# Patient Record
Sex: Male | Born: 2000 | Race: Black or African American | Hispanic: No | Smoking: Never smoker
Health system: Southern US, Community
[De-identification: ages and names within clinical notes are randomized; demographics above are authoritative.]

## PROBLEM LIST (undated history)

## (undated) DIAGNOSIS — D849 Immunodeficiency, unspecified: Secondary | ICD-10-CM

## (undated) DIAGNOSIS — F909 Attention-deficit hyperactivity disorder, unspecified type: Secondary | ICD-10-CM

## (undated) DIAGNOSIS — Z21 Asymptomatic human immunodeficiency virus [HIV] infection status: Secondary | ICD-10-CM

## (undated) DIAGNOSIS — B20 Human immunodeficiency virus [HIV] disease: Secondary | ICD-10-CM

## (undated) HISTORY — DX: Attention-deficit hyperactivity disorder, unspecified type: F90.9

## (undated) HISTORY — PX: ORIF TIBIA FRACTURE: SHX5416

---

## 2019-08-03 ENCOUNTER — Ambulatory Visit (INDEPENDENT_AMBULATORY_CARE_PROVIDER_SITE_OTHER): Payer: Medicaid Other | Admitting: Pharmacist

## 2019-08-03 ENCOUNTER — Telehealth: Payer: Self-pay | Admitting: Pharmacy Technician

## 2019-08-03 ENCOUNTER — Other Ambulatory Visit: Payer: Self-pay

## 2019-08-03 ENCOUNTER — Ambulatory Visit (INDEPENDENT_AMBULATORY_CARE_PROVIDER_SITE_OTHER): Payer: Medicaid Other | Admitting: Family

## 2019-08-03 ENCOUNTER — Encounter: Payer: Self-pay | Admitting: Family

## 2019-08-03 VITALS — BP 153/54 | HR 110 | Temp 98.6°F | Wt 173.0 lb

## 2019-08-03 DIAGNOSIS — B20 Human immunodeficiency virus [HIV] disease: Secondary | ICD-10-CM

## 2019-08-03 MED ORDER — BICTEGRAVIR-EMTRICITAB-TENOFOV 50-200-25 MG PO TABS: 1.0000 | ORAL_TABLET | Freq: Every day | ORAL | 2 refills | Status: DC

## 2019-08-03 NOTE — Telephone Encounter (Signed)
[facility] Patient Advocate Encounter  Insurance verification completed.    The patient is insured through [place] [employer]ICA[employer] and has a $0 copay.    Alyza Artiaga E. Ravi Tuccillo, CPhT [facility]ty [facility] Phone: [phone] Fax:  [phone]   

## 2019-08-03 NOTE — Progress Notes (Signed)
 HPI: Gavin Spencer is a 18 y.o. male who presents to the [facility] clinic today to initiate care with Greg for his newly diagnosed HIV infection.  There are no active problems to display for this patient.   Patient's Medications  New Prescriptions   No medications on file  Previous Medications   BICTEGRAVIR-EMTRICITABINE-TENOFOVIR AF (BIKTARVY) 50-200-25 MG TABS TABLET    Take 1 tablet by mouth daily.  Modified Medications   No medications on file  Discontinued Medications   No medications on file    Allergies: No Known Allergies  Past Medical History: Past Medical History:  Diagnosis Date  . ADHD     Social History: Social History   Socioeconomic History  . Marital status: Single    Spouse name: Not on file  . Number of children: 0  . Years of education: 14  . Highest education level: Not on file  Occupational History  . Not on file  Social Needs  . Financial resource strain: Not on file  . Food insecurity    Worry: Not on file    Inability: Not on file  . Transportation needs    Medical: Not on file    Non-medical: Not on file  Tobacco Use  . Smoking status: Never Smoker  . Smokeless tobacco: Never Used  Substance and Sexual Activity  . Alcohol use: Yes    Comment: Occasional  . Drug use: Never  . Sexual activity: Not on file  Lifestyle  . Physical activity    Days per week: Not on file    Minutes per session: Not on file  . Stress: Not on file  Relationships  . Social connections    Talks on phone: Not on file    Gets together: Not on file    Attends religious service: Not on file    Active member of club or organization: Not on file    Attends meetings of clubs or organizations: Not on file    Relationship status: Not on file  Other Topics Concern  . Not on file  Social History Narrative  . Not on file    Labs: No results found for: HIV1RNAQUANT, HIV1RNAVL, CD4TABS  RPR and STI No results found for: LABRPR, RPRTITER  No flowsheet data  found.  Hepatitis B No results found for: HEPBSAB, HEPBSAG, HEPBCAB Hepatitis C No results found for: HEPCAB, HCVRNAPCRQN Hepatitis A No results found for: HAV Lipids: No results found for: CHOL, TRIG, HDL, CHOLHDL, VLDL, LDLCALC  Current HIV Regimen: Treatment naive - will start Biktarvy  Assessment: Gavin Spencer is here today to initiate care with Greg for his newly diagnosed HIV infection.  He is treatment naive with a new HIV infection, we will obtain a baseline HIV viral load and CD4 count today. We will also do an HIV genotype to assess for any resistance mechanisms. Will start patient on Biktarvy.  Gavin Spencer was counseled he will need to take his Biktarvy at the same time on a daily basis, with or without food. Some of the most common side effects include headache and nausea/vomiting. He may take over the counter pain relievers to treat any headaches he initially experiences and he should try taking the Biktarvy with food if it upsets his stomach. He expressed understanding of everything we talked about and will call to let us know if he starts any new medication, experiences any unusual side effects or has any questions. The patient has Medicaid and will be able to pick his medication up   this afternoon to start therapy.  Plan: -Start Biktarvy -Check CMET, HIV viral load, CD4, CBC, lipid panel, RPR, HLA B*5701, G6PD and quantiferon-TB Gold -Follow up with Marya Amsler in a month   Nicoletta Dress, PharmD PGY2 Infectious Disease Pharmacy Resident  [facility] 08/03/2019, 2:24 PM

## 2019-08-03 NOTE — Progress Notes (Signed)
Subjective:    Patient ID: Gavin Spencer, male    DOB: 2001-09-13, 18 y.o.   MRN: [id number]  Chief Complaint  Patient presents with  . HIV Positive/AIDS    HPI:  Gavin Spencer is a 18 y.o. male with previous medical history of ADD who presents today for a positive HIV test.   [facility] for concern of sexually transmitted infection due to his partner testing positive for Gonorrhea. He also was positive for gonorrhea and was treated with ceftriaxone and azithromycin. At this visit he completed HIV screening as well which returned positive. HIV antibody testing was negative but HIV RNA levels were present which was consistent with acute HIV infection. Currently has no symptoms and is feeling well. Denies fevers, chills, night sweats, headaches, changes in vision, neck pain/stiffness, nausea, diarrhea, vomiting, lesions or rashes.   Gavin Spencer is in his sophomore year of school at [employer]. He just started working at [employer]. Risk factor for acquiring HIV is MSM and believes he knows when he may have been infected. Denies any recreational or illicit drug use or tobacco use. Alcohol use is on occasion. He does have a psychiatry history of ADD. He was raised through the foster care system and has no current close family contacts. He has not shared his diagnosis, but has researched a little on the Internet.    No Known Allergies    No outpatient medications prior to visit.   No facility-administered medications prior to visit.      Past Medical History:  Diagnosis Date  . ADHD       History reviewed. No pertinent surgical history.    Family History  Adopted: Yes      Social History   Socioeconomic History  . Marital status: Single    Spouse name: Not on file  . Number of children: 0  . Years of education: 70  . Highest education level: Not on file  Occupational History  .  Not on file  Social Needs  . Financial resource strain: Not on file  . Food insecurity    Worry: Not on file    Inability: Not on file  . Transportation needs    Medical: Not on file    Non-medical: Not on file  Tobacco Use  . Smoking status: Never Smoker  . Smokeless tobacco: Never Used  Substance and Sexual Activity  . Alcohol use: Yes    Comment: Occasional  . Drug use: Never  . Sexual activity: Not on file  Lifestyle  . Physical activity    Days per week: Not on file    Minutes per session: Not on file  . Stress: Not on file  Relationships  . Social Herbalist on phone: Not on file    Gets together: Not on file    Attends religious service: Not on file    Active member of club or organization: Not on file    Attends meetings of clubs or organizations: Not on file    Relationship status: Not on file  . Intimate partner violence    Fear of current or ex partner: Not on file    Emotionally abused: Not on file    Physically abused: Not on file    Forced sexual activity: Not on file  Other Topics Concern  . Not on file  Social History Narrative  . Not on file  Review of Systems  Constitutional: Negative for appetite change, chills, fatigue, fever and unexpected weight change.  Eyes: Negative for visual disturbance.  Respiratory: Negative for cough, chest tightness, shortness of breath and wheezing.   Cardiovascular: Negative for chest pain and leg swelling.  Gastrointestinal: Negative for abdominal pain, constipation, diarrhea, nausea and vomiting.  Genitourinary: Negative for dysuria, flank pain, frequency, genital sores, hematuria and urgency.  Skin: Negative for rash.  Allergic/Immunologic: Negative for immunocompromised state.  Neurological: Negative for dizziness and headaches.       Objective:    BP (!) 153/54   Pulse (!) 110   Temp 98.6 F (37 C)   Wt 173 lb (78.5 kg)  Nursing note and vital signs reviewed.  Physical Exam  Constitutional:      General: He is not in acute distress.    Appearance: He is well-developed.  Eyes:     Conjunctiva/sclera: Conjunctivae normal.  Neck:     Musculoskeletal: Neck supple.  Cardiovascular:     Rate and Rhythm: Regular rhythm. Tachycardia present.     Heart sounds: Normal heart sounds. No murmur. No friction rub. No gallop.   Pulmonary:     Effort: Pulmonary effort is normal. No respiratory distress.     Breath sounds: Normal breath sounds. No wheezing or rales.  Chest:     Chest wall: No tenderness.  Abdominal:     General: Bowel sounds are normal.     Palpations: Abdomen is soft.     Tenderness: There is no abdominal tenderness.  Lymphadenopathy:     Cervical: No cervical adenopathy.  Skin:    General: Skin is warm and dry.     Findings: No rash.  Neurological:     Mental Status: He is alert and oriented to person, place, and time.  Psychiatric:        Mood and Affect: Mood is anxious.        Behavior: Behavior normal.        Thought Content: Thought content normal.        Judgment: Judgment normal.        Assessment & Plan:   Patient Active Problem List   Diagnosis Date Noted  . Acute HIV infection ([place]) 08/03/2019     Problem List Items Addressed This Visit      Other   Acute HIV infection ([place]) - Primary    Gavin Spencer is a 18 y/o male with apparent acute HIV infection with HIV RNA present and antibody negative likely acquired through MSM. He is asymptomatic and has no signs/symptoms of opportunistic infection. We discussed the pathophysiology, transmission, prevention, risks if left untreated/progression, and treatment of HIV with time for questions. Clinic orientation and introduction to staff including pharmacy and financial counseling was also completed. He declines counseling at the present time and is mentally doing okay. Obtain baseline blood work today.Covered through Medicaid and will plan to start [place]. Follow up in 1 month or sooner  if needed.       Relevant Medications   bictegravir-emtricitabine-tenofovir AF (BIKTARVY) 50-200-25 MG TABS tablet   Other Relevant Orders   COMPLETE METABOLIC PANEL WITH GFR   Glucose 6 phosphate dehydrogenase   HIV-1 RNA ultraquant reflex to gentyp+   HLA B*5701   Lipid panel   RPR   T-helper cell (CD4)- ([facility] clinic only)   QuantiFERON-TB Gold Plus   CBC w/Diff       I am having [name] start on bictegravir-emtricitabine-tenofovir AF.   Meds  ordered this encounter  Medications  . bictegravir-emtricitabine-tenofovir AF (BIKTARVY) 50-200-25 MG TABS tablet    Sig: Take 1 tablet by mouth daily.    Dispense:  30 tablet    Refill:  2    Order Specific Question:   Supervising Provider    Answer:   Carlyle Basques [[id number]]     Follow-up: Return in about 1 month (around 09/02/2019), or if symptoms worsen or fail to improve.    Terri Piedra, MSN, FNP-C Nurse Practitioner [facility] Office phone: [phone] Pager: [place] number: [phone]

## 2019-08-03 NOTE — Assessment & Plan Note (Signed)
Gavin Spencer is a 18 y/o male with apparent acute HIV infection with HIV RNA present and antibody negative likely acquired through MSM. He is asymptomatic and has no signs/symptoms of opportunistic infection. We discussed the pathophysiology, transmission, prevention, risks if left untreated/progression, and treatment of HIV with time for questions. Clinic orientation and introduction to staff including pharmacy and financial counseling was also completed. He declines counseling at the present time and is mentally doing okay. Obtain baseline blood work today.Covered through Medicaid and will plan to start [place]. Follow up in 1 month or sooner if needed.

## 2019-08-03 NOTE — Patient Instructions (Signed)
Nice to meet you.  We will check your blood work today and inform you via MyChart of the results.  Your medication is called Biktarvy and will be available at [facility] at [place].  Please let us know if you have any questions.  Plan for follow up in 1 month or sooner if needed - we will do lab work at that appointment.   Have a great day and stay safe!

## 2019-08-04 LAB — T-HELPER CELL (CD4) - (RCID CLINIC ONLY)
CD4 % Helper T Cell: 20 % — ABNORMAL LOW (ref 33–65)
CD4 T Cell Abs: 394 /uL — ABNORMAL LOW (ref 400–1790)

## 2019-08-17 LAB — QUANTIFERON-TB GOLD PLUS
Mitogen-NIL: >10 IU/mL
NIL: 0.05 IU/mL
QuantiFERON-TB Gold Plus: NEGATIVE
TB1-NIL: 0 IU/mL
TB2-NIL: 0 IU/mL

## 2019-08-17 LAB — COMPLETE METABOLIC PANEL WITH GFR
AG Ratio: 1.5 (calc) (ref 1.0–2.5)
ALT: 145 U/L — ABNORMAL HIGH (ref 8–46)
AST: 64 U/L — ABNORMAL HIGH (ref 12–32)
Albumin: 4.9 g/dL (ref 3.6–5.1)
Alkaline phosphatase (APISO): 92 U/L (ref 46–169)
BUN: 13 mg/dL (ref 7–20)
CO2: 26 mmol/L (ref 20–32)
Calcium: 10.6 mg/dL — ABNORMAL HIGH (ref 8.9–10.4)
Chloride: 102 mmol/L (ref 98–110)
Creat: 1.06 mg/dL (ref 0.60–1.26)
GFR, Est African American: 117 mL/min/{1.73_m2} (ref >=60)
GFR, Est Non African American: 101 mL/min/{1.73_m2} (ref >=60)
Globulin: 3.2 g/dL (calc) (ref 2.1–3.5)
Glucose, Bld: 90 mg/dL (ref 65–99)
Potassium: 4.6 mmol/L (ref 3.8–5.1)
Sodium: 138 mmol/L (ref 135–146)
Total Bilirubin: 0.7 mg/dL (ref 0.2–1.1)
Total Protein: 8.1 g/dL (ref 6.3–8.2)

## 2019-08-17 LAB — CBC WITH DIFFERENTIAL/PLATELET
Absolute Monocytes: 318 cells/uL (ref 200–950)
Basophils Absolute: 22 cells/uL (ref 0–200)
Basophils Relative: 0.5 %
Eosinophils Absolute: 9 cells/uL — ABNORMAL LOW (ref 15–500)
Eosinophils Relative: 0.2 %
HCT: 43.6 % (ref 38.5–50.0)
Hemoglobin: 15.2 g/dL (ref 13.2–17.1)
Lymphs Abs: 2150 cells/uL (ref 850–3900)
MCH: 27.2 pg (ref 27.0–33.0)
MCHC: 34.9 g/dL (ref 32.0–36.0)
MCV: 78 fL — ABNORMAL LOW (ref 80.0–100.0)
MPV: 9.8 fL (ref 7.5–12.5)
Monocytes Relative: 7.4 %
Neutro Abs: 1802 cells/uL (ref 1500–7800)
Neutrophils Relative %: 41.9 %
Platelets: 353 10*3/uL (ref 140–400)
RBC: 5.59 10*6/uL (ref 4.20–5.80)
RDW: 12.7 % (ref 11.0–15.0)
Total Lymphocyte: 50 %
WBC: 4.3 10*3/uL (ref 3.8–10.8)

## 2019-08-17 LAB — LIPID PANEL
Cholesterol: 148 mg/dL (ref <170)
HDL: 41 mg/dL — ABNORMAL LOW (ref >45)
LDL Cholesterol (Calc): 96 mg/dL (calc) (ref <110)
Non-HDL Cholesterol (Calc): 107 mg/dL (calc) (ref <120)
Total CHOL/HDL Ratio: 3.6 (calc) (ref <5.0)
Triglycerides: 37 mg/dL (ref <90)

## 2019-08-17 LAB — HLA B*5701: HLA-B*5701 w/rflx HLA-B High: NEGATIVE

## 2019-08-17 LAB — RPR: RPR Ser Ql: NONREACTIVE

## 2019-08-17 LAB — HIV-1 GENOTYPE: HIV-1 Genotype: DETECTED — AB

## 2019-08-17 LAB — GLUCOSE 6 PHOSPHATE DEHYDROGENASE: G-6PDH: 10.2 U/g Hgb (ref 7.0–20.5)

## 2019-08-17 LAB — HIV-1 RNA ULTRAQUANT REFLEX TO GENTYP+
HIV 1 RNA Quant: 224000 copies/mL — ABNORMAL HIGH
HIV-1 RNA Quant, Log: 5.35 Log copies/mL — ABNORMAL HIGH

## 2019-08-31 ENCOUNTER — Encounter: Payer: Self-pay | Admitting: Family

## 2019-09-02 ENCOUNTER — Ambulatory Visit (INDEPENDENT_AMBULATORY_CARE_PROVIDER_SITE_OTHER): Payer: Medicaid Other | Admitting: Family

## 2019-09-02 ENCOUNTER — Encounter: Payer: Self-pay | Admitting: Family

## 2019-09-02 ENCOUNTER — Other Ambulatory Visit: Payer: Self-pay

## 2019-09-02 VITALS — BP 144/89 | HR 80 | Temp 98.0°F | Wt 184.0 lb

## 2019-09-02 DIAGNOSIS — Z0000 Encounter for general adult medical examination without abnormal findings: Secondary | ICD-10-CM | POA: Diagnosis not present

## 2019-09-02 DIAGNOSIS — Z113 Encounter for screening for infections with a predominantly sexual mode of transmission: Secondary | ICD-10-CM

## 2019-09-02 DIAGNOSIS — B20 Human immunodeficiency virus [HIV] disease: Secondary | ICD-10-CM | POA: Diagnosis present

## 2019-09-02 LAB — T-HELPER CELL (CD4) - (RCID CLINIC ONLY)
CD4 % Helper T Cell: 29 % — ABNORMAL LOW (ref 33–65)
CD4 T Cell Abs: 640 /uL (ref 400–1790)

## 2019-09-02 NOTE — Patient Instructions (Signed)
Good to see you.  We will check your blood work today.  Continue to take your Mucarabones as prescribed daily.  Plan for follow up in 6 weeks or sooner if needed with lab work 1-2 weeks prior to your appointment.   Have a great day and stay safe!  Happy Holidays!

## 2019-09-02 NOTE — Assessment & Plan Note (Signed)
Gavin Spencer appears to be doing well with good adherence and tolerance to his ART regimen of Biktarvy.  No signs/symptoms of opportunistic infection or progressive HIV disease.  Discussed U equals U.  Reviewed previous lab work and discussed plan of care.  We will check blood work today.  Continue current dose of Biktarvy.  Plan for follow-up in 6 weeks or sooner if needed with lab work 1 to 2 weeks prior to appointment.

## 2019-09-02 NOTE — Progress Notes (Signed)
Subjective:    Patient ID: Gavin Spencer, male    DOB: 13-Jun-2001, 18 y.o.   MRN: [id number]  Chief Complaint  Patient presents with  . Follow-up    offered condoms; offered flu shot (considering); asked about food resources; will provide; no other complaints     HPI:  Gavin Spencer is a 18 y.o. male with newly diagnosed HIV who was last seen in the office on 08/03/2019 to establish care with a viral load of 224,000 and CD4 nadir of 394.  Genotype was without significant resistance showing wild-type virus.  He was started on Biktarvy.  Gavin Spencer returns today having taking his Biktarvy as prescribed with no adverse side effects or missed doses since his last office visit.  Overall feeling well with no new concerns/complaints. Denies fevers, chills, night sweats, headaches, changes in vision, neck pain/stiffness, nausea, diarrhea, vomiting, lesions or rashes.  Mr. Matheny has no problems obtaining his medications from the pharmacy and remains covered through [facility].  Denies feelings of being down, depressed, or hopeless recently.  No recreational or illicit drug use, tobacco use, or alcohol consumption.  Not currently sexually active.  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   No Known Allergies    Outpatient Medications Prior to Visit  Medication Sig Dispense Refill  . bictegravir-emtricitabine-tenofovir AF (BIKTARVY) 50-200-25 MG TABS tablet Take 1 tablet by mouth daily. 30 tablet 2   No facility-administered medications prior to visit.      Past Medical History:  Diagnosis Date  . ADHD     History reviewed. No pertinent surgical history.   Review of Systems  Constitutional: Negative for appetite change, chills, fatigue, fever and unexpected weight change.  Eyes: Negative for visual disturbance.  Respiratory: Negative for cough, chest tightness, shortness of breath and wheezing.   Cardiovascular:  Negative for chest pain and leg swelling.  Gastrointestinal: Negative for abdominal pain, constipation, diarrhea, nausea and vomiting.  Genitourinary: Negative for dysuria, flank pain, frequency, genital sores, hematuria and urgency.  Skin: Negative for rash.  Allergic/Immunologic: Negative for immunocompromised state.  Neurological: Negative for dizziness and headaches.      Objective:    BP (!) 144/89   Pulse 80   Temp 98 F (36.7 C) (Oral)   Wt 184 lb (83.5 kg)  Nursing note and vital signs reviewed.  Physical Exam Constitutional:      General: He is not in acute distress.    Appearance: He is well-developed.  Eyes:     Conjunctiva/sclera: Conjunctivae normal.  Neck:     Musculoskeletal: Neck supple.  Cardiovascular:     Rate and Rhythm: Normal rate and regular rhythm.     Heart sounds: Normal heart sounds. No murmur. No friction rub. No gallop.   Pulmonary:     Effort: Pulmonary effort is normal. No respiratory distress.     Breath sounds: Normal breath sounds. No wheezing or rales.  Chest:     Chest wall: No tenderness.  Abdominal:     General: Bowel sounds are normal.     Palpations: Abdomen is soft.     Tenderness: There is no abdominal tenderness.  Lymphadenopathy:     Cervical: No cervical adenopathy.  Skin:    General: Skin is warm and dry.     Findings: No rash.  Neurological:     Mental Status: He is alert and oriented to person, place, and time.  Psychiatric:  Behavior: Behavior normal.        Thought Content: Thought content normal.        Judgment: Judgment normal.      Depression screen [facility] 2/9 09/02/2019 08/03/2019  Decreased Interest 0 0  Down, Depressed, Hopeless 0 0  PHQ - 2 Score 0 0       Assessment & Plan:    Patient Active Problem List   Diagnosis Date Noted  . Healthcare maintenance 09/02/2019  . Acute HIV infection ([place]) 08/03/2019     Problem List Items Addressed This Visit      Other   Acute HIV infection ([place]) -  Primary    Gavin Spencer appears to be doing well with good adherence and tolerance to his ART regimen of Biktarvy.  No signs/symptoms of opportunistic infection or progressive HIV disease.  Discussed U equals U.  Reviewed previous lab work and discussed plan of care.  We will check blood work today.  Continue current dose of Biktarvy.  Plan for follow-up in 6 weeks or sooner if needed with lab work 1 to 2 weeks prior to appointment.      Relevant Orders   COMPLETE METABOLIC PANEL WITH GFR   T-helper cell (CD4)- ([facility] clinic only)   HIV-1 RNA quant-no reflex-bld   6 month CD4   6 month VL   6 month Apison maintenance     Declines vaccinations today.  Discussed importance of safe sexual practice to reduce risk of STI.  Condoms provided.       Other Visit Diagnoses    Screening for STDs (sexually transmitted diseases)       Relevant Orders   6 month RPR       I am having [name] maintain his bictegravir-emtricitabine-tenofovir AF.   Follow-up: Return in about 6 weeks (around 10/14/2019), or if symptoms worsen or fail to improve.   Terri Piedra, MSN, FNP-C Nurse Practitioner [facility] [place] number: [phone]

## 2019-09-02 NOTE — Assessment & Plan Note (Signed)
   Declines vaccinations today.  Discussed importance of safe sexual practice to reduce risk of STI.  Condoms provided. 

## 2019-09-12 LAB — COMPLETE METABOLIC PANEL WITH GFR
AG Ratio: 2 (calc) (ref 1.0–2.5)
ALT: 30 U/L (ref 8–46)
AST: 26 U/L (ref 12–32)
Albumin: 4.6 g/dL (ref 3.6–5.1)
Alkaline phosphatase (APISO): 79 U/L (ref 46–169)
BUN: 12 mg/dL (ref 7–20)
CO2: 25 mmol/L (ref 20–32)
Calcium: 9.9 mg/dL (ref 8.9–10.4)
Chloride: 106 mmol/L (ref 98–110)
Creat: 1.17 mg/dL (ref 0.60–1.26)
GFR, Est African American: 104 mL/min/{1.73_m2} (ref >=60)
GFR, Est Non African American: 90 mL/min/{1.73_m2} (ref >=60)
Globulin: 2.3 g/dL (calc) (ref 2.1–3.5)
Glucose, Bld: 89 mg/dL (ref 65–99)
Potassium: 4.1 mmol/L (ref 3.8–5.1)
Sodium: 141 mmol/L (ref 135–146)
Total Bilirubin: 0.4 mg/dL (ref 0.2–1.1)
Total Protein: 6.9 g/dL (ref 6.3–8.2)

## 2019-09-12 LAB — HIV-1 RNA QUANT-NO REFLEX-BLD
HIV 1 RNA Quant: <20 copies/mL — AB
HIV-1 RNA Quant, Log: <1.3 Log copies/mL — AB

## 2019-10-03 ENCOUNTER — Other Ambulatory Visit: Payer: Self-pay

## 2019-10-03 ENCOUNTER — Other Ambulatory Visit: Payer: Medicaid Other

## 2019-10-03 DIAGNOSIS — B20 Human immunodeficiency virus [HIV] disease: Secondary | ICD-10-CM

## 2019-10-03 DIAGNOSIS — Z113 Encounter for screening for infections with a predominantly sexual mode of transmission: Secondary | ICD-10-CM

## 2019-10-04 LAB — T-HELPER CELL (CD4) - (RCID CLINIC ONLY)
CD4 % Helper T Cell: 28 % — ABNORMAL LOW (ref 33–65)
CD4 T Cell Abs: 500 /uL (ref 400–1790)

## 2019-10-07 LAB — COMPREHENSIVE METABOLIC PANEL
AG Ratio: 2 (calc) (ref 1.0–2.5)
ALT: 18 U/L (ref 8–46)
AST: 13 U/L (ref 12–32)
Albumin: 4.7 g/dL (ref 3.6–5.1)
Alkaline phosphatase (APISO): 76 U/L (ref 46–169)
BUN: 8 mg/dL (ref 7–20)
CO2: 31 mmol/L (ref 20–32)
Calcium: 10.4 mg/dL (ref 8.9–10.4)
Chloride: 105 mmol/L (ref 98–110)
Creat: 1.07 mg/dL (ref 0.60–1.26)
Globulin: 2.3 g/dL (calc) (ref 2.1–3.5)
Glucose, Bld: 65 mg/dL (ref 65–99)
Potassium: 4.2 mmol/L (ref 3.8–5.1)
Sodium: 142 mmol/L (ref 135–146)
Total Bilirubin: 0.5 mg/dL (ref 0.2–1.1)
Total Protein: 7 g/dL (ref 6.3–8.2)

## 2019-10-07 LAB — RPR: RPR Ser Ql: NONREACTIVE

## 2019-10-07 LAB — HIV-1 RNA QUANT-NO REFLEX-BLD
HIV 1 RNA Quant: <20 copies/mL
HIV-1 RNA Quant, Log: <1.3 Log copies/mL

## 2019-10-21 ENCOUNTER — Telehealth: Payer: Self-pay

## 2019-10-21 NOTE — Telephone Encounter (Signed)
COVID-19 Pre-Screening Questions:10/21/19  Do you currently have a fever (>100 F), chills or unexplained body aches?NO   Are you currently experiencing new cough, shortness of breath, sore throat, runny nose? NO .  Have you recently travelled outside the state of [place] in the last 14 days?NO  .  Have you been in contact with someone that is currently pending confirmation of Covid19 testing or has been confirmed to have the Covid19 virus?  NO  **If the patient answers NO to ALL questions -  advise the patient to please call the clinic before coming to the office should any symptoms develop.     

## 2019-10-24 ENCOUNTER — Ambulatory Visit (INDEPENDENT_AMBULATORY_CARE_PROVIDER_SITE_OTHER): Payer: Medicaid Other | Admitting: Family

## 2019-10-24 ENCOUNTER — Other Ambulatory Visit: Payer: Self-pay

## 2019-10-24 ENCOUNTER — Encounter: Payer: Self-pay | Admitting: Family

## 2019-10-24 VITALS — BP 141/80 | HR 83 | Temp 98.0°F | Ht 67.0 in | Wt 176.0 lb

## 2019-10-24 DIAGNOSIS — Z113 Encounter for screening for infections with a predominantly sexual mode of transmission: Secondary | ICD-10-CM | POA: Diagnosis not present

## 2019-10-24 DIAGNOSIS — B20 Human immunodeficiency virus [HIV] disease: Secondary | ICD-10-CM | POA: Diagnosis present

## 2019-10-24 DIAGNOSIS — Z0000 Encounter for general adult medical examination without abnormal findings: Secondary | ICD-10-CM | POA: Diagnosis not present

## 2019-10-24 MED ORDER — BICTEGRAVIR-EMTRICITAB-TENOFOV 50-200-25 MG PO TABS: 1.0000 | ORAL_TABLET | Freq: Every day | ORAL | 2 refills | Status: DC

## 2019-10-24 NOTE — Patient Instructions (Signed)
Nice to see you.  We will check your HSV titer today.  Continue to take your [place] as prescribed daily.   Refills will be sent to the pharmacy.  Plan for follow up in 3 months or sooner if needed with lab work 1-2 weeks prior to appointment.  Have a great day and stay safe!

## 2019-10-24 NOTE — Assessment & Plan Note (Signed)
Gavin Spencer continues to do well with good adherence and tolerance to his ART regimen of Biktarvy.  No signs/symptoms of opportunistic infection or progressive HIV disease.  Reviewed lab work and discussed plan of care.  He would like to be checked for herpes simplex virus.  Continue current dose of Biktarvy.  Plan for follow-up in 3 months or sooner if needed with lab work 1 to 2 weeks prior to appointment on same day.

## 2019-10-24 NOTE — Assessment & Plan Note (Signed)
   Discussed importance of safe sexual practice to reduce risk of STI.  Check for herpes simplex virus today per Mr. Reicher request.

## 2019-10-24 NOTE — Progress Notes (Signed)
Subjective:    Patient ID: Gavin Spencer, male    DOB: 2001/02/25, 19 y.o.   MRN: [id number]  Chief Complaint  Patient presents with  . Follow-up     HPI:  Gavin Spencer is a 19 y.o. male with HIV disease who was last seen on 09/02/19 with good adherence and tolerance to his ART regimen of Biktarvy. Viral load at that time was undetectable with CD4 count of 640. Most recent blood work completed on 10/03/19 with viral load that remains undetectable and CD4 count of 500.   Gavin Spencer continues to take his Biktarvy as prescribed with no adverse side effects or missed doses.  Overall feeling well today with no new concerns/complaints. Denies fevers, chills, night sweats, headaches, changes in vision, neck pain/stiffness, nausea, diarrhea, vomiting, lesions or rashes.  Gavin Spencer has no problems obtaining his medication from the pharmacy and remains covered through [facility].  Denies any feelings of being down, depressed, or hopeless recently.  He does smoke marijuana every other day and drinks alcohol socially.  Denies tobacco use.  Uses condoms when sexually active.   No Known Allergies    Outpatient Medications Prior to Visit  Medication Sig Dispense Refill  . bictegravir-emtricitabine-tenofovir AF (BIKTARVY) 50-200-25 MG TABS tablet Take 1 tablet by mouth daily. 30 tablet 2   No facility-administered medications prior to visit.     Past Medical History:  Diagnosis Date  . ADHD     History reviewed. No pertinent surgical history.   Review of Systems  Constitutional: Negative for appetite change, chills, fatigue, fever and unexpected weight change.  Eyes: Negative for visual disturbance.  Respiratory: Negative for cough, chest tightness, shortness of breath and wheezing.   Cardiovascular: Negative for chest pain and leg swelling.  Gastrointestinal: Negative for abdominal pain, constipation, diarrhea, nausea and vomiting.  Genitourinary: Negative for dysuria, flank  pain, frequency, genital sores, hematuria and urgency.  Skin: Negative for rash.  Allergic/Immunologic: Negative for immunocompromised state.  Neurological: Negative for dizziness and headaches.      Objective:    BP (!) 141/80   Pulse 83   Temp 98 F (36.7 C)   Ht 5\' 7"  (1.702 m)   Wt 176 lb (79.8 kg)   SpO2 98%   BMI 27.57 kg/m  Nursing note and vital signs reviewed.  Physical Exam Constitutional:      General: He is not in acute distress.    Appearance: He is well-developed.  Eyes:     Conjunctiva/sclera: Conjunctivae normal.  Cardiovascular:     Rate and Rhythm: Normal rate and regular rhythm.     Heart sounds: Normal heart sounds. No murmur. No friction rub. No gallop.   Pulmonary:     Effort: Pulmonary effort is normal. No respiratory distress.     Breath sounds: Normal breath sounds. No wheezing or rales.  Chest:     Chest wall: No tenderness.  Abdominal:     General: Bowel sounds are normal.     Palpations: Abdomen is soft.     Tenderness: There is no abdominal tenderness.  Musculoskeletal:     Cervical back: Neck supple.  Lymphadenopathy:     Cervical: No cervical adenopathy.  Skin:    General: Skin is warm and dry.     Findings: No rash.  Neurological:     Mental Status: He is alert and oriented to person, place, and time.  Psychiatric:        Behavior: Behavior normal.  Thought Content: Thought content normal.        Judgment: Judgment normal.      Depression screen [facility] 2/9 09/02/2019 08/03/2019  Decreased Interest 0 0  Down, Depressed, Hopeless 0 0  PHQ - 2 Score 0 0       Assessment & Plan:    Patient Active Problem List   Diagnosis Date Noted  . Healthcare maintenance 09/02/2019  . HIV disease ([place]) 08/03/2019     Problem List Items Addressed This Visit      Other   HIV disease ([place]) - Primary    Gavin Spencer continues to do well with good adherence and tolerance to his ART regimen of Biktarvy.  No signs/symptoms of  opportunistic infection or progressive HIV disease.  Reviewed lab work and discussed plan of care.  He would like to be checked for herpes simplex virus.  Continue current dose of Biktarvy.  Plan for follow-up in 3 months or sooner if needed with lab work 1 to 2 weeks prior to appointment on same day.      Relevant Medications   bictegravir-emtricitabine-tenofovir AF (BIKTARVY) 50-200-25 MG TABS tablet   Other Relevant Orders   T-helper cell (CD4)- ([facility] clinic only)   HIV-1 RNA quant-no reflex-bld   Comprehensive metabolic panel   Healthcare maintenance     Discussed importance of safe sexual practice to reduce risk of STI.  Check for herpes simplex virus today per Gavin Spencer request.       Other Visit Diagnoses    Screening for STDs (sexually transmitted diseases)       Relevant Orders   HSV(herpes simplex vrs) 1+2 ab-IgG   RPR   Urine cytology ancillary only([place])       I am having Gavin Spencer maintain his bictegravir-emtricitabine-tenofovir AF.   Meds ordered this encounter  Medications  . bictegravir-emtricitabine-tenofovir AF (BIKTARVY) 50-200-25 MG TABS tablet    Sig: Take 1 tablet by mouth daily.    Dispense:  30 tablet    Refill:  2    Order Specific Question:   Supervising Provider    Answer:   Carlyle Basques [[id number]]     Follow-up: Return in about 3 months (around 01/22/2020), or if symptoms worsen or fail to improve.   Terri Piedra, MSN, FNP-C Nurse Practitioner [facility] for Infectious Disease [place] number: [phone]

## 2019-10-25 LAB — HSV(HERPES SIMPLEX VRS) I + II AB-IGG
HAV 1 IGG,TYPE SPECIFIC AB: 1.63 index — ABNORMAL HIGH
HSV 2 IGG,TYPE SPECIFIC AB: <0.9 index

## 2020-01-09 ENCOUNTER — Other Ambulatory Visit: Payer: Medicaid Other

## 2020-01-09 ENCOUNTER — Other Ambulatory Visit: Payer: Self-pay

## 2020-01-09 DIAGNOSIS — B20 Human immunodeficiency virus [HIV] disease: Secondary | ICD-10-CM

## 2020-01-09 DIAGNOSIS — Z113 Encounter for screening for infections with a predominantly sexual mode of transmission: Secondary | ICD-10-CM

## 2020-01-10 LAB — T-HELPER CELL (CD4) - (RCID CLINIC ONLY)
CD4 % Helper T Cell: 32 % — ABNORMAL LOW (ref 33–65)
CD4 T Cell Abs: 761 /uL (ref 400–1790)

## 2020-01-11 LAB — COMPREHENSIVE METABOLIC PANEL
AG Ratio: 1.6 (calc) (ref 1.0–2.5)
ALT: 12 U/L (ref 8–46)
AST: 14 U/L (ref 12–32)
Albumin: 4.2 g/dL (ref 3.6–5.1)
Alkaline phosphatase (APISO): 79 U/L (ref 46–169)
BUN: 12 mg/dL (ref 7–20)
CO2: 26 mmol/L (ref 20–32)
Calcium: 9.9 mg/dL (ref 8.9–10.4)
Chloride: 105 mmol/L (ref 98–110)
Creat: 1.03 mg/dL (ref 0.60–1.26)
Globulin: 2.6 g/dL (calc) (ref 2.1–3.5)
Glucose, Bld: 93 mg/dL (ref 65–99)
Potassium: 4.1 mmol/L (ref 3.8–5.1)
Sodium: 139 mmol/L (ref 135–146)
Total Bilirubin: 0.2 mg/dL (ref 0.2–1.1)
Total Protein: 6.8 g/dL (ref 6.3–8.2)

## 2020-01-11 LAB — RPR TITER: RPR Titer: 1:2 {titer} — ABNORMAL HIGH

## 2020-01-11 LAB — FLUORESCENT TREPONEMAL AB(FTA)-IGG-BLD: Fluorescent Treponemal ABS: NONREACTIVE

## 2020-01-11 LAB — HIV-1 RNA QUANT-NO REFLEX-BLD
HIV 1 RNA Quant: <20 copies/mL — AB
HIV-1 RNA Quant, Log: <1.3 Log copies/mL — AB

## 2020-01-11 LAB — RPR: RPR Ser Ql: REACTIVE — AB

## 2020-01-24 ENCOUNTER — Encounter: Payer: Medicaid Other | Admitting: Family

## 2020-01-29 ENCOUNTER — Other Ambulatory Visit: Payer: Self-pay | Admitting: Family

## 2020-03-09 ENCOUNTER — Other Ambulatory Visit: Payer: Self-pay | Admitting: Family

## 2020-03-17 ENCOUNTER — Other Ambulatory Visit: Payer: Self-pay

## 2020-03-19 ENCOUNTER — Other Ambulatory Visit: Payer: Self-pay | Admitting: *Deleted

## 2020-03-19 DIAGNOSIS — B20 Human immunodeficiency virus [HIV] disease: Secondary | ICD-10-CM

## 2020-03-19 MED ORDER — BIKTARVY 50-200-25 MG PO TABS: 1.0000 | ORAL_TABLET | Freq: Every day | ORAL | 0 refills | Status: DC

## 2020-03-20 ENCOUNTER — Ambulatory Visit (INDEPENDENT_AMBULATORY_CARE_PROVIDER_SITE_OTHER): Payer: Medicaid Other | Admitting: Family

## 2020-03-20 ENCOUNTER — Encounter: Payer: Self-pay | Admitting: Family

## 2020-03-20 ENCOUNTER — Other Ambulatory Visit: Payer: Self-pay

## 2020-03-20 VITALS — BP 131/80 | HR 163 | Temp 98.8°F | Wt 166.0 lb

## 2020-03-20 DIAGNOSIS — Z0000 Encounter for general adult medical examination without abnormal findings: Secondary | ICD-10-CM | POA: Diagnosis not present

## 2020-03-20 DIAGNOSIS — B20 Human immunodeficiency virus [HIV] disease: Secondary | ICD-10-CM

## 2020-03-20 MED ORDER — BIKTARVY 50-200-25 MG PO TABS: 1.0000 | ORAL_TABLET | Freq: Every day | ORAL | 3 refills | Status: DC

## 2020-03-20 NOTE — Patient Instructions (Signed)
Nice to see you.  We will continue your current dose of Biktarvy.   Refills have been sent to the pharmacy.  Plan for follow up in 3 months or sooner if needed with lab work 1-2 weeks prior to appointment.   Have a great day and stay safe!

## 2020-03-20 NOTE — Assessment & Plan Note (Signed)
Gavin Spencer continues to have well-controlled HIV disease with good adherence and tolerance to his ART regimen of Biktarvy.  No signs/symptoms of opportunistic infection or progressive HIV disease.  We reviewed lab work and discussed the plan of care.  Continue current dose of Biktarvy.  Plan for follow-up in 3 months or sooner if needed with lab work 1 to 2 weeks prior to appointment.

## 2020-03-20 NOTE — Assessment & Plan Note (Signed)
   Discussed/recommended Covid vaccination.  Discussed importance of safe sexual practice to reduce risk of STI.  Condoms provided.  Encouraged routine dental care.

## 2020-03-20 NOTE — Progress Notes (Signed)
Subjective:    Patient ID: Gavin Spencer, male    DOB: 2000/10/25, 19 y.o.   MRN: [id number]  Chief Complaint  Patient presents with  . Follow-up     HPI:  Gavin Spencer is a 19 y.o. male with HIV disease who was last seen in the office on 10/24/2019 with good adherence and tolerance to his ART regimen of Biktarvy.  Viral load at the time was undetectable with CD4 count of 500.  Most recent blood work completed on 01/09/2020 with viral load that remains undetectable and CD4 count of 761.  Here today for routine follow-up.  Gavin Spencer continues to take his Biktarvy daily as prescribed with no adverse side effects or missed doses since his last office visit.  Overall feeling well today with no new concerns/complaints. Denies fevers, chills, night sweats, headaches, changes in vision, neck pain/stiffness, nausea, diarrhea, vomiting, lesions or rashes.  Gavin Spencer has no problems obtaining his medication from the pharmacy and remains covered through [facility].  Denies feelings of being down, depressed, or hopeless recently.  Continues to smoke marijuana socially.  No current alcohol consumption or tobacco use.  Has not received the Covid vaccination but is considering.  Due for dental cleaning in about 1 month.  He is sexually active and requesting condoms.  Continues to work full-time..   No Known Allergies    Outpatient Medications Prior to Visit  Medication Sig Dispense Refill  . bictegravir-emtricitabine-tenofovir AF (BIKTARVY) 50-200-25 MG TABS tablet Take 1 tablet by mouth daily. 30 tablet 0   No facility-administered medications prior to visit.     Past Medical History:  Diagnosis Date  . ADHD      History reviewed. No pertinent surgical history.     Review of Systems  Constitutional: Negative for appetite change, chills, fatigue, fever and unexpected weight change.  Eyes: Negative for visual disturbance.  Respiratory: Negative for cough, chest tightness,  shortness of breath and wheezing.   Cardiovascular: Negative for chest pain and leg swelling.  Gastrointestinal: Negative for abdominal pain, constipation, diarrhea, nausea and vomiting.  Genitourinary: Negative for dysuria, flank pain, frequency, genital sores, hematuria and urgency.  Skin: Negative for rash.  Allergic/Immunologic: Negative for immunocompromised state.  Neurological: Negative for dizziness and headaches.      Objective:    BP 131/80   Pulse (!) 163   Temp 98.8 F (37.1 C) (Oral)   Wt 166 lb (75.3 kg)   BMI 26.00 kg/m  Nursing note and vital signs reviewed.  Physical Exam Constitutional:      General: He is not in acute distress.    Appearance: He is well-developed.  Eyes:     Conjunctiva/sclera: Conjunctivae normal.  Cardiovascular:     Rate and Rhythm: Normal rate and regular rhythm.     Heart sounds: Normal heart sounds. No murmur heard.  No friction rub. No gallop.   Pulmonary:     Effort: Pulmonary effort is normal. No respiratory distress.     Breath sounds: Normal breath sounds. No wheezing or rales.  Chest:     Chest wall: No tenderness.  Abdominal:     General: Bowel sounds are normal.     Palpations: Abdomen is soft.     Tenderness: There is no abdominal tenderness.  Musculoskeletal:     Cervical back: Neck supple.  Lymphadenopathy:     Cervical: No cervical adenopathy.  Skin:    General: Skin is warm and dry.     Findings: No rash.  Neurological:     Mental Status: He is alert and oriented to person, place, and time.  Psychiatric:        Behavior: Behavior normal.        Thought Content: Thought content normal.        Judgment: Judgment normal.      Depression screen [facility] 2/9 09/02/2019 08/03/2019  Decreased Interest 0 0  Down, Depressed, Hopeless 0 0  PHQ - 2 Score 0 0       Assessment & Plan:    Patient Active Problem List   Diagnosis Date Noted  . Healthcare maintenance 09/02/2019  . HIV disease ([place]) 08/03/2019      Problem List Items Addressed This Visit      Other   HIV disease ([place]) - Primary    Gavin Spencer continues to have well-controlled HIV disease with good adherence and tolerance to his ART regimen of Biktarvy.  No signs/symptoms of opportunistic infection or progressive HIV disease.  We reviewed lab work and discussed the plan of care.  Continue current dose of Biktarvy.  Plan for follow-up in 3 months or sooner if needed with lab work 1 to 2 weeks prior to appointment.      Relevant Medications   bictegravir-emtricitabine-tenofovir AF (BIKTARVY) 50-200-25 MG TABS tablet   Healthcare maintenance     Discussed/recommended Covid vaccination.  Discussed importance of safe sexual practice to reduce risk of STI.  Condoms provided.  Encouraged routine dental care.          I am having Gavin Spencer maintain his Biktarvy.   Meds ordered this encounter  Medications  . bictegravir-emtricitabine-tenofovir AF (BIKTARVY) 50-200-25 MG TABS tablet    Sig: Take 1 tablet by mouth daily.    Dispense:  30 tablet    Refill:  3    Order Specific Question:   Supervising Provider    Answer:   Carlyle Basques [[id number]]     Follow-up: Return in about 3 months (around 06/20/2020), or if symptoms worsen or fail to improve.   Terri Piedra, MSN, FNP-C Nurse Practitioner [facility] for Infectious Disease [place] number: [phone]

## 2020-04-14 ENCOUNTER — Other Ambulatory Visit: Payer: Self-pay | Admitting: Family

## 2020-04-14 DIAGNOSIS — B20 Human immunodeficiency virus [HIV] disease: Secondary | ICD-10-CM

## 2020-05-02 ENCOUNTER — Other Ambulatory Visit: Payer: Self-pay

## 2020-05-02 ENCOUNTER — Ambulatory Visit (INDEPENDENT_AMBULATORY_CARE_PROVIDER_SITE_OTHER): Payer: Medicaid Other | Admitting: *Deleted

## 2020-05-02 DIAGNOSIS — A539 Syphilis, unspecified: Secondary | ICD-10-CM | POA: Diagnosis present

## 2020-05-02 MED ORDER — PENICILLIN G BENZATHINE 1200000 UNIT/2ML IM SUSP
1.2000 10*6.[IU] | Freq: Once | INTRAMUSCULAR | Status: AC
  Administered 2020-05-02: 1.2 10*6.[IU] via INTRAMUSCULAR

## 2020-06-18 ENCOUNTER — Other Ambulatory Visit: Payer: Self-pay

## 2020-06-18 ENCOUNTER — Other Ambulatory Visit (HOSPITAL_COMMUNITY)
Admission: RE | Admit: 2020-06-18 | Discharge: 2020-06-18 | Disposition: A | Discharge status: Home / Self Care | Payer: Medicaid Other | Source: Ambulatory Visit | Attending: Family | Admitting: Family

## 2020-06-18 ENCOUNTER — Other Ambulatory Visit: Payer: Medicaid Other

## 2020-06-18 DIAGNOSIS — B20 Human immunodeficiency virus [HIV] disease: Secondary | ICD-10-CM

## 2020-06-18 DIAGNOSIS — Z113 Encounter for screening for infections with a predominantly sexual mode of transmission: Secondary | ICD-10-CM

## 2020-06-18 DIAGNOSIS — Z79899 Other long term (current) drug therapy: Secondary | ICD-10-CM

## 2020-06-19 ENCOUNTER — Ambulatory Visit (HOSPITAL_COMMUNITY): Payer: Medicaid Other

## 2020-06-19 LAB — URINE CYTOLOGY ANCILLARY ONLY
Chlamydia: NEGATIVE
Comment: NEGATIVE
Comment: NORMAL
Neisseria Gonorrhea: NEGATIVE

## 2020-06-19 LAB — T-HELPER CELL (CD4) - (RCID CLINIC ONLY)
CD4 % Helper T Cell: 36 % (ref 33–65)
CD4 T Cell Abs: 727 /uL (ref 400–1790)

## 2020-06-20 LAB — COMPREHENSIVE METABOLIC PANEL
AG Ratio: 1.7 (calc) (ref 1.0–2.5)
ALT: 32 U/L (ref 9–46)
AST: 34 U/L (ref 10–40)
Albumin: 4.7 g/dL (ref 3.6–5.1)
Alkaline phosphatase (APISO): 73 U/L (ref 36–130)
BUN: 15 mg/dL (ref 7–25)
CO2: 29 mmol/L (ref 20–32)
Calcium: 10 mg/dL (ref 8.6–10.3)
Chloride: 105 mmol/L (ref 98–110)
Creat: 1.12 mg/dL (ref 0.60–1.35)
Globulin: 2.7 g/dL (calc) (ref 1.9–3.7)
Glucose, Bld: 87 mg/dL (ref 65–99)
Potassium: 4.4 mmol/L (ref 3.5–5.3)
Sodium: 139 mmol/L (ref 135–146)
Total Bilirubin: 0.5 mg/dL (ref 0.2–1.2)
Total Protein: 7.4 g/dL (ref 6.1–8.1)

## 2020-06-20 LAB — CBC WITH DIFFERENTIAL/PLATELET
Absolute Monocytes: 288 cells/uL (ref 200–950)
Basophils Absolute: 8 cells/uL (ref 0–200)
Basophils Relative: 0.2 %
Eosinophils Absolute: 28 cells/uL (ref 15–500)
Eosinophils Relative: 0.7 %
HCT: 37.9 % — ABNORMAL LOW (ref 38.5–50.0)
Hemoglobin: 12.8 g/dL — ABNORMAL LOW (ref 13.2–17.1)
Lymphs Abs: 2068 cells/uL (ref 850–3900)
MCH: 28.3 pg (ref 27.0–33.0)
MCHC: 33.8 g/dL (ref 32.0–36.0)
MCV: 83.7 fL (ref 80.0–100.0)
MPV: 10.1 fL (ref 7.5–12.5)
Monocytes Relative: 7.2 %
Neutro Abs: 1608 cells/uL (ref 1500–7800)
Neutrophils Relative %: 40.2 %
Platelets: 294 10*3/uL (ref 140–400)
RBC: 4.53 10*6/uL (ref 4.20–5.80)
RDW: 13.2 % (ref 11.0–15.0)
Total Lymphocyte: 51.7 %
WBC: 4 10*3/uL (ref 3.8–10.8)

## 2020-06-20 LAB — LIPID PANEL
Cholesterol: 115 mg/dL (ref <200)
HDL: 42 mg/dL (ref >=40)
LDL Cholesterol (Calc): 60 mg/dL (calc)
Non-HDL Cholesterol (Calc): 73 mg/dL (calc) (ref <130)
Total CHOL/HDL Ratio: 2.7 (calc) (ref <5.0)
Triglycerides: 45 mg/dL (ref <150)

## 2020-06-20 LAB — RPR: RPR Ser Ql: REACTIVE — AB

## 2020-06-20 LAB — HIV-1 RNA QUANT-NO REFLEX-BLD
HIV 1 RNA Quant: <20 Copies/mL
HIV-1 RNA Quant, Log: <1.3 Log cps/mL

## 2020-06-20 LAB — RPR TITER: RPR Titer: 1:16 {titer} — ABNORMAL HIGH

## 2020-06-20 LAB — FLUORESCENT TREPONEMAL AB(FTA)-IGG-BLD: Fluorescent Treponemal ABS: REACTIVE — AB

## 2020-07-02 ENCOUNTER — Encounter: Payer: Medicaid Other | Admitting: Family

## 2020-07-16 NOTE — Telephone Encounter (Signed)
Nov 4, Dec 4, Charletta Cousin DJM4 apr7 QAS34 Pearline Cables [id number] sept1 Spoke with [facility] - Patient has refills available, has had pretty reliable medication fills.  RN sent him a message. Andree Coss, RN

## 2020-07-30 ENCOUNTER — Telehealth (INDEPENDENT_AMBULATORY_CARE_PROVIDER_SITE_OTHER): Payer: Medicaid Other | Admitting: Family

## 2020-07-30 ENCOUNTER — Encounter: Payer: Self-pay | Admitting: Family

## 2020-07-30 ENCOUNTER — Other Ambulatory Visit: Payer: Self-pay

## 2020-07-30 DIAGNOSIS — B20 Human immunodeficiency virus [HIV] disease: Secondary | ICD-10-CM

## 2020-07-30 DIAGNOSIS — A515 Early syphilis, latent: Secondary | ICD-10-CM

## 2020-07-30 DIAGNOSIS — A539 Syphilis, unspecified: Secondary | ICD-10-CM | POA: Insufficient documentation

## 2020-07-30 MED ORDER — BIKTARVY 50-200-25 MG PO TABS: 1.0000 | ORAL_TABLET | Freq: Every day | ORAL | 3 refills | Status: DC

## 2020-07-30 NOTE — Assessment & Plan Note (Signed)
Gavin Spencer continues to have well-controlled HIV disease with good adherence and tolerance to his ART regimen of Biktarvy.  No symptoms of opportunistic infection or progressive HIV disease.  We reviewed previous lab work and discussed plan of care.  Continue current dose of Biktarvy.  Plan for follow-up in 3 months or sooner if needed with lab work 1 to 2 weeks prior to appointment.

## 2020-07-30 NOTE — Progress Notes (Signed)
Subjective:    Patient ID: Gavin Spencer, male    DOB: 19-Oct-2000, 19 y.o.   MRN: [id number]  Chief Complaint  Patient presents with   Follow-up     Virtual Visit via Telephone/Video Note   I connected with Gavin Spencer on 07/30/2020 at 3:30pm by telephone and verified that I am speaking with the correct person using two identifiers.   I discussed the limitations, risks, security and privacy concerns of performing an evaluation and management service by telephone and the availability of in person appointments. I also discussed with the patient that there may be a patient responsible charge related to this service. The patient expressed understanding and agreed to proceed.  Location:  Patient: Home Provider: Clinic   HPI:  Gavin Spencer is a 19 y.o. 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  Viral load at the time was undetectable with CD4 count of 761.  Most recent blood work completed on 06/18/2020 with a viral load that remains undetectable and CD4 count of 727.  RPR was positive at 1: 16 up from previous 1: 2 and he had been treated with 2,400,000 units of Bicillin in August.  Gavin Spencer continues to take his Susanne Borders daily as prescribed with no adverse side effects or missed doses since his last office visit.  Overall feeling well today with no new concerns/complaints. Denies fevers, chills, night sweats, headaches, changes in vision, neck pain/stiffness, nausea, diarrhea, vomiting, lesions or rashes.  Gavin Spencer has no problems obtaining his medication from the pharmacy and remains covered through [facility].  He does continue to smoke marijuana on a daily basis with no tobacco use and alcohol on occasion.  Routine dental care is up-to-date although coming due in the next month.  Has completed his Pfizer vaccination for [employer].  Denies feelings of being  down, depressed, or hopeless recently.  He continues to dance.  No Known Allergies    Outpatient Medications Prior to Visit  Medication Sig Dispense Refill   ibuprofen (ADVIL) 200 MG tablet Take 200 mg by mouth every 6 (six) hours as needed.     BIKTARVY 50-200-25 MG TABS tablet TAKE 1 TABLET BY MOUTH DAILY 30 tablet 3   No facility-administered medications prior to visit.     Past Medical History:  Diagnosis Date   ADHD      No past surgical history on file.     Review of Systems  Constitutional: Negative for appetite change, chills, fatigue, fever and unexpected weight change.  Eyes: Negative for visual disturbance.  Respiratory: Negative for cough, chest tightness, shortness of breath and wheezing.   Cardiovascular: Negative for chest pain and leg swelling.  Gastrointestinal: Negative for abdominal pain, constipation, diarrhea, nausea and vomiting.  Genitourinary: Negative for dysuria, flank pain, frequency, genital sores, hematuria and urgency.  Skin: Negative for rash.  Allergic/Immunologic: Negative for immunocompromised state.  Neurological: Negative for dizziness and headaches.      Objective:    Nursing note and vital signs reviewed.    Gavin Spencer sounds to be doing well and is pleasant to speak with.  Physical exam limited secondary to telehealth visit.  Assessment & Plan:   Problem List Items Addressed This Visit      Other   HIV disease [facility])    Gavin Spencer continues to have well-controlled HIV disease with good adherence and tolerance to his  ART regimen of Biktarvy.  No symptoms of opportunistic infection or progressive HIV disease.  We reviewed previous lab work and discussed plan of care.  Continue current dose of Biktarvy.  Plan for follow-up in 3 months or sooner if needed with lab work 1 to 2 weeks prior to appointment.      Relevant Medications   bictegravir-emtricitabine-tenofovir AF (BIKTARVY) 50-200-25 MG TABS tablet   Other Relevant  Orders   HIV-1 RNA quant-no reflex-bld   Comprehensive metabolic panel   T-helper cell (CD4)- ([facility] clinic only)   Early syphilis, latent    Gavin Spencer was treated for early latent syphilis in August 2021 with a positive RPR titer of 1: 2.  Subsequently increased to 1: 16 in September 2021.  He was treated with 2,400,000 units of Bicillin once.  Discussed need to recheck RPR to determine current status.  Blood work placed to be completed at his convenience.      Relevant Medications   bictegravir-emtricitabine-tenofovir AF (BIKTARVY) 50-200-25 MG TABS tablet   Other Relevant Orders   RPR       I have changed [employer]. I am also having him maintain his ibuprofen.   Meds ordered this encounter  Medications   bictegravir-emtricitabine-tenofovir AF (BIKTARVY) 50-200-25 MG TABS tablet    Sig: Take 1 tablet by mouth daily.    Dispense:  30 tablet    Refill:  3    Order Specific Question:   Supervising Provider    Answer:   Judyann Munson [phone]     I discussed the assessment and treatment plan with the patient. The patient was provided an opportunity to ask questions and all were answered. The patient agreed with the plan and demonstrated an understanding of the instructions.   The patient was advised to call back or seek an in-person evaluation if the symptoms worsen or if the condition fails to improve as anticipated.   I provided   8 minutes of non-face-to-face time during this encounter.  Follow-up: Return in about 3 months (around 10/30/2020), or if symptoms worsen or fail to improve.   Marcos Eke, MSN, FNP-C Nurse Practitioner [facility] for Infectious Disease [facility] Main number: [phone]

## 2020-07-30 NOTE — Assessment & Plan Note (Signed)
Gavin Spencer was treated for early latent syphilis in August 2021 with a positive RPR titer of 1: 2.  Subsequently increased to 1: 16 in September 2021.  He was treated with 2,400,000 units of Bicillin once.  Discussed need to recheck RPR to determine current status.  Blood work placed to be completed at his convenience.

## 2020-07-30 NOTE — Patient Instructions (Signed)
Nice to speak with you.  Continue to take your [place] daily as prescribed.  Refills have been sent to the pharmacy.  Please make a lab appointment to recheck your RPR.  Plan for follow-up in 3 months or sooner if needed with lab work 1 to 2 weeks prior to appointment or on same day.  Have a great day and stay safe!

## 2020-10-29 ENCOUNTER — Ambulatory Visit (INDEPENDENT_AMBULATORY_CARE_PROVIDER_SITE_OTHER): Payer: Medicaid Other | Admitting: Family

## 2020-10-29 ENCOUNTER — Other Ambulatory Visit: Payer: Self-pay

## 2020-10-29 ENCOUNTER — Encounter: Payer: Self-pay | Admitting: Family

## 2020-10-29 VITALS — BP 148/87 | HR 88 | Temp 98.1°F | Wt 168.4 lb

## 2020-10-29 DIAGNOSIS — Z0000 Encounter for general adult medical examination without abnormal findings: Secondary | ICD-10-CM | POA: Diagnosis not present

## 2020-10-29 DIAGNOSIS — B20 Human immunodeficiency virus [HIV] disease: Secondary | ICD-10-CM

## 2020-10-29 DIAGNOSIS — A515 Early syphilis, latent: Secondary | ICD-10-CM | POA: Diagnosis not present

## 2020-10-29 DIAGNOSIS — Z23 Encounter for immunization: Secondary | ICD-10-CM | POA: Diagnosis not present

## 2020-10-29 MED ORDER — BIKTARVY 50-200-25 MG PO TABS: 1.0000 | ORAL_TABLET | Freq: Every day | ORAL | 5 refills | Status: DC

## 2020-10-29 NOTE — Patient Instructions (Signed)
Nice to see you.  We will check your lab work today.  Continue to take your [place] daily as prescribed.  Refills have been sent to the pharmacy.   Recommend Covid booster when qualified.   Continue with routine dental care.   Plan for follow up in 4 months or sooner if needed with lab work on the same day.  Have a great day and stay safe!

## 2020-10-29 NOTE — Assessment & Plan Note (Signed)
   Influenza updated today.  Due for Covid booster in February.  Routine dental care is up-to-date per recommendations.  Discussed importance of safe sexual practice to reduce risk of STI.  Condoms declined.

## 2020-10-29 NOTE — Assessment & Plan Note (Signed)
Gavin Spencer continues to have well-controlled HIV disease with good adherence and tolerance to his ART regimen of Biktarvy.  No signs/symptoms of opportunistic infection or progressive HIV disease.  We reviewed lab work and discussed plan of care.  Check blood work today.  Continue current dose of Biktarvy.  Plan for follow-up in 4 months or sooner if needed.

## 2020-10-29 NOTE — Progress Notes (Signed)
Subjective:    Patient ID: Gavin Spencer, male    DOB: 10-Jan-2001, 19 y.o.   MRN: [id number]  Chief Complaint  Patient presents with  . HIV Positive/AIDS     HPI:  Gavin Spencer is a 20 y.o. male he has HIV disease last seen through telehealth on 07/30/2020 with good adherence and tolerance to his ART regimen of Biktarvy.  Viral load at the time was undetectable with CD4 count of 727.  No recent blood work completed.  Here today for routine follow-up.  Gavin Spencer continues to take his Biktarvy daily as prescribed with no adverse side effects or missed doses since his last office visit.  Overall feeling well today with no new concerns/complaints. Denies fevers, chills, night sweats, headaches, changes in vision, neck pain/stiffness, nausea, diarrhea, vomiting, lesions or rashes.  Gavin Spencer has no problems obtaining his medication from the pharmacy.  Smokes marijuana on a daily basis with occasional alcohol consumption and no tobacco use.  Denies feelings of being down, depressed, or hopeless recently.  Interested in receiving flu shot today.  Due for Covid booster in February.  Declines condoms.  Routine dental care is up-to-date per recommendations.   No Known Allergies    Outpatient Medications Prior to Visit  Medication Sig Dispense Refill  . ibuprofen (ADVIL) 200 MG tablet Take 200 mg by mouth every 6 (six) hours as needed.    . bictegravir-emtricitabine-tenofovir AF (BIKTARVY) 50-200-25 MG TABS tablet Take 1 tablet by mouth daily. 30 tablet 3   No facility-administered medications prior to visit.     Past Medical History:  Diagnosis Date  . ADHD     History reviewed. No pertinent surgical history.   Review of Systems  Constitutional: Negative for appetite change, chills, fatigue, fever and unexpected weight change.  Eyes: Negative for visual disturbance.  Respiratory: Negative for cough, chest tightness, shortness of breath and wheezing.   Cardiovascular:  Negative for chest pain and leg swelling.  Gastrointestinal: Negative for abdominal pain, constipation, diarrhea, nausea and vomiting.  Genitourinary: Negative for dysuria, flank pain, frequency, genital sores, hematuria and urgency.  Skin: Negative for rash.  Allergic/Immunologic: Negative for immunocompromised state.  Neurological: Negative for dizziness and headaches.      Objective:    BP (!) 148/87   Pulse 88   Temp 98.1 F (36.7 C) (Oral)   Wt 168 lb 6.4 oz (76.4 kg)   BMI 26.38 kg/m  Nursing note and vital signs reviewed.  Physical Exam Constitutional:      General: He is not in acute distress.    Appearance: He is well-developed.  HENT:     Mouth/Throat:     Mouth: Oropharynx is clear and moist.  Eyes:     Conjunctiva/sclera: Conjunctivae normal.  Cardiovascular:     Rate and Rhythm: Normal rate and regular rhythm.     Pulses: Intact distal pulses.     Heart sounds: Normal heart sounds. No murmur heard. No friction rub. No gallop.   Pulmonary:     Effort: Pulmonary effort is normal. No respiratory distress.     Breath sounds: Normal breath sounds. No wheezing or rales.  Chest:     Chest wall: No tenderness.  Abdominal:     General: Bowel sounds are normal.     Palpations: Abdomen is soft.     Tenderness: There is no abdominal tenderness.  Musculoskeletal:     Cervical back: Neck supple.  Lymphadenopathy:     Cervical: No cervical adenopathy.  Skin:    General: Skin is warm and dry.     Findings: No rash.  Neurological:     Mental Status: He is alert and oriented to person, place, and time.  Psychiatric:        Mood and Affect: Mood and affect normal.        Behavior: Behavior normal.        Thought Content: Thought content normal.        Judgment: Judgment normal.      Depression screen [facility] 2/9 07/30/2020 09/02/2019 08/03/2019  Decreased Interest 0 0 0  Down, Depressed, Hopeless 0 0 0  PHQ - 2 Score 0 0 0       Assessment & Plan:    Patient  Active Problem List   Diagnosis Date Noted  . 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 Healthcare maintenance 09/02/2019  . HIV disease (HCC) 08/03/2019     Problem List Items Addressed This Visit      Other   HIV disease (HCC) - Primary    Gavin Spencer continues to have well-controlled HIV disease with good adherence and tolerance to his ART regimen of Biktarvy.  No signs/symptoms of opportunistic infection or progressive HIV disease.  We reviewed lab work and discussed plan of care.  Check blood work today.  Continue current dose of Biktarvy.  Plan for follow-up in 4 months or sooner if needed.      Relevant Medications   bictegravir-emtricitabine-tenofovir AF (BIKTARVY) 50-200-25 MG TABS tablet   Other Relevant Orders   COMPLETE METABOLIC PANEL WITH GFR   HIV-1 RNA quant-no reflex-bld   T-helper cell (CD4)- ([facility] clinic only)   Healthcare maintenance     Influenza updated today.  Due for Covid booster in February.  Routine dental care is up-to-date per recommendations.  Discussed importance of safe sexual practice to reduce risk of STI.  Condoms declined.      Early syphilis, latent    Previously treated with titer of 1: 16.  No current symptoms today.  Recheck RPR.  Continue to monitor.      Relevant Medications   bictegravir-emtricitabine-tenofovir AF (BIKTARVY) 50-200-25 MG TABS tablet   Other Relevant Orders   RPR       I am having Gavin Spencer maintain his ibuprofen and Biktarvy.   Meds ordered this encounter  Medications  . bictegravir-emtricitabine-tenofovir AF (BIKTARVY) 50-200-25 MG TABS tablet    Sig: Take 1 tablet by mouth daily.    Dispense:  30 tablet    Refill:  5    Order Specific Question:   Supervising Provider    Answer:   Judyann Munson [[id number]]     Follow-up: Return in about 4 months (around 02/26/2021), or if symptoms worsen or fail to improve.   Marcos Eke, MSN, FNP-C Nurse Practitioner [facility]ious [facility] Main number: [phone]

## 2020-10-29 NOTE — Assessment & Plan Note (Signed)
Previously treated with titer of 1: 16.  No current symptoms today.  Recheck RPR.  Continue to monitor.

## 2020-10-30 LAB — T-HELPER CELL (CD4) - (RCID CLINIC ONLY)
CD4 % Helper T Cell: 30 % — ABNORMAL LOW (ref 33–65)
CD4 T Cell Abs: 534 /uL (ref 400–1790)

## 2020-11-01 LAB — COMPLETE METABOLIC PANEL WITH GFR
AG Ratio: 1.4 (calc) (ref 1.0–2.5)
ALT: 21 U/L (ref 9–46)
AST: 16 U/L (ref 10–40)
Albumin: 4.6 g/dL (ref 3.6–5.1)
Alkaline phosphatase (APISO): 75 U/L (ref 36–130)
BUN: 17 mg/dL (ref 7–25)
CO2: 29 mmol/L (ref 20–32)
Calcium: 10.1 mg/dL (ref 8.6–10.3)
Chloride: 102 mmol/L (ref 98–110)
Creat: 0.97 mg/dL (ref 0.60–1.35)
GFR, Est African American: 130 mL/min/{1.73_m2} (ref >=60)
GFR, Est Non African American: 112 mL/min/{1.73_m2} (ref >=60)
Globulin: 3.4 g/dL (calc) (ref 1.9–3.7)
Glucose, Bld: 87 mg/dL (ref 65–99)
Potassium: 4.2 mmol/L (ref 3.5–5.3)
Sodium: 138 mmol/L (ref 135–146)
Total Bilirubin: 0.4 mg/dL (ref 0.2–1.2)
Total Protein: 8 g/dL (ref 6.1–8.1)

## 2020-11-01 LAB — RPR: RPR Ser Ql: REACTIVE — AB

## 2020-11-01 LAB — FLUORESCENT TREPONEMAL AB(FTA)-IGG-BLD: Fluorescent Treponemal ABS: REACTIVE — AB

## 2020-11-01 LAB — RPR TITER: RPR Titer: 1:128 {titer} — ABNORMAL HIGH

## 2020-11-01 LAB — HIV-1 RNA QUANT-NO REFLEX-BLD
HIV 1 RNA Quant: <20 Copies/mL — ABNORMAL HIGH
HIV-1 RNA Quant, Log: <1.3 Log cps/mL — ABNORMAL HIGH

## 2020-11-05 ENCOUNTER — Other Ambulatory Visit: Payer: Self-pay

## 2020-11-05 ENCOUNTER — Ambulatory Visit (INDEPENDENT_AMBULATORY_CARE_PROVIDER_SITE_OTHER): Payer: Medicaid Other

## 2020-11-05 DIAGNOSIS — A539 Syphilis, unspecified: Secondary | ICD-10-CM

## 2020-11-05 MED ORDER — PENICILLIN G BENZATHINE 1200000 UNIT/2ML IM SUSP
1.2000 10*6.[IU] | Freq: Once | INTRAMUSCULAR | Status: AC
  Administered 2020-11-05: 1.2 10*6.[IU] via INTRAMUSCULAR

## 2020-11-05 NOTE — Progress Notes (Signed)
Reviewed and verified allergies with patient. Patient tolerated Bicillin injections well. Reinforced abstinence for 10 days after treatment, offered condoms and encouraged use. Advised patient to notify sexual partners for testing and treatment. Patient verbalized understanding.   Brick Ketcher D Katyana Trolinger, RN     

## 2020-12-02 ENCOUNTER — Encounter (HOSPITAL_COMMUNITY): Payer: Self-pay | Admitting: Emergency Medicine

## 2020-12-02 ENCOUNTER — Emergency Department (HOSPITAL_COMMUNITY)
Admission: EM | Admit: 2020-12-02 | Discharge: 2020-12-02 | Disposition: A | Discharge status: Home / Self Care | Payer: Medicaid Other | Attending: Emergency Medicine | Admitting: Emergency Medicine

## 2020-12-02 ENCOUNTER — Other Ambulatory Visit: Payer: Self-pay

## 2020-12-02 DIAGNOSIS — Z21 Asymptomatic human immunodeficiency virus [HIV] infection status: Secondary | ICD-10-CM | POA: Insufficient documentation

## 2020-12-02 DIAGNOSIS — M6281 Muscle weakness (generalized): Secondary | ICD-10-CM | POA: Insufficient documentation

## 2020-12-02 DIAGNOSIS — R000 Tachycardia, unspecified: Secondary | ICD-10-CM | POA: Insufficient documentation

## 2020-12-02 DIAGNOSIS — R112 Nausea with vomiting, unspecified: Secondary | ICD-10-CM | POA: Insufficient documentation

## 2020-12-02 HISTORY — DX: Immunodeficiency, unspecified: D84.9

## 2020-12-02 HISTORY — DX: Human immunodeficiency virus (HIV) disease: B20

## 2020-12-02 HISTORY — DX: Asymptomatic human immunodeficiency virus (hiv) infection status: Z21

## 2020-12-02 LAB — COMPREHENSIVE METABOLIC PANEL
ALT: 34 U/L (ref 0–44)
AST: 28 U/L (ref 15–41)
Albumin: 4.7 g/dL (ref 3.5–5.0)
Alkaline Phosphatase: 57 U/L (ref 38–126)
Anion gap: 13 (ref 5–15)
BUN: 21 mg/dL — ABNORMAL HIGH (ref 6–20)
CO2: 24 mmol/L (ref 22–32)
Calcium: 10.4 mg/dL — ABNORMAL HIGH (ref 8.9–10.3)
Chloride: 99 mmol/L (ref 98–111)
Creatinine, Ser: 1.07 mg/dL (ref 0.61–1.24)
GFR, Estimated: >60 mL/min (ref >60)
Glucose, Bld: 82 mg/dL (ref 70–99)
Potassium: 4 mmol/L (ref 3.5–5.1)
Sodium: 136 mmol/L (ref 135–145)
Total Bilirubin: 1.3 mg/dL — ABNORMAL HIGH (ref 0.3–1.2)
Total Protein: 8.7 g/dL — ABNORMAL HIGH (ref 6.5–8.1)

## 2020-12-02 LAB — CBC
HCT: 39.7 % (ref 39.0–52.0)
Hemoglobin: 14 g/dL (ref 13.0–17.0)
MCH: 29.1 pg (ref 26.0–34.0)
MCHC: 35.3 g/dL (ref 30.0–36.0)
MCV: 82.5 fL (ref 80.0–100.0)
Platelets: 254 10*3/uL (ref 150–400)
RBC: 4.81 MIL/uL (ref 4.22–5.81)
RDW: 12.5 % (ref 11.5–15.5)
WBC: 7.3 10*3/uL (ref 4.0–10.5)
nRBC: 0 % (ref 0.0–0.2)

## 2020-12-02 LAB — URINALYSIS, ROUTINE W REFLEX MICROSCOPIC
Bilirubin Urine: NEGATIVE
Glucose, UA: NEGATIVE mg/dL
Hgb urine dipstick: NEGATIVE
Ketones, ur: 80 mg/dL — AB
Leukocytes,Ua: NEGATIVE
Nitrite: NEGATIVE
Protein, ur: NEGATIVE mg/dL
Specific Gravity, Urine: 1.029 (ref 1.005–1.030)
pH: 6 (ref 5.0–8.0)

## 2020-12-02 LAB — LIPASE, BLOOD: Lipase: 25 U/L (ref 11–51)

## 2020-12-02 MED ORDER — HYDROMORPHONE HCL 1 MG/ML IJ SOLN
1.0000 mg | Freq: Once | INTRAMUSCULAR | Status: AC
  Administered 2020-12-02: 1 mg via INTRAVENOUS
  Filled 2020-12-02: qty 1

## 2020-12-02 MED ORDER — ONDANSETRON 4 MG PO TBDP: 4.0000 mg | ORAL_TABLET | Freq: Three times a day (TID) | ORAL | 0 refills | Status: DC | PRN

## 2020-12-02 MED ORDER — KETOROLAC TROMETHAMINE 15 MG/ML IJ SOLN
15.0000 mg | Freq: Once | INTRAMUSCULAR | Status: AC
  Administered 2020-12-02: 15 mg via INTRAVENOUS
  Filled 2020-12-02: qty 1

## 2020-12-02 MED ORDER — ONDANSETRON HCL 4 MG/2ML IJ SOLN
4.0000 mg | Freq: Once | INTRAMUSCULAR | Status: AC | PRN
  Administered 2020-12-02: 4 mg via INTRAVENOUS
  Filled 2020-12-02: qty 2

## 2020-12-02 MED ORDER — FAMOTIDINE IN NACL 20-0.9 MG/50ML-% IV SOLN
20.0000 mg | Freq: Once | INTRAVENOUS | Status: AC
  Administered 2020-12-02: 20 mg via INTRAVENOUS
  Filled 2020-12-02: qty 50

## 2020-12-02 MED ORDER — SODIUM CHLORIDE 0.9 % IV BOLUS
1000.0000 mL | Freq: Once | INTRAVENOUS | Status: AC
  Administered 2020-12-02: 1000 mL via INTRAVENOUS

## 2020-12-02 NOTE — ED Notes (Signed)
Patient drink 200 ml water and tolerated well.

## 2020-12-02 NOTE — ED Triage Notes (Signed)
Patient arrives complaining of a sudden onset of vomiting yesterday. Patient states he broke his leg and had surgery on Thursday. Patient states last night he began having a sudden onset of vomiting, no other symptoms. Patient states he is unable to tolerate PO.

## 2020-12-02 NOTE — ED Provider Notes (Signed)
[facility]-EMERGENCY DEPT Provider Note   CSN: [id number] Arrival date & time: 12/02/20  0239     History Chief Complaint  Patient presents with  . Emesis    Gavin Spencer is a 20 y.o. male with a hx of HIV ( last CD4 534), ADHD, syphilis, and recent tibia ORIF with intramedullary nailing performed 11/29/20 by Dr. Dimas Aguas who presents to the ED with complaints of generalized weakness & emesis that began tonight.  Patient states he remained in the hospital until he was discharged yesterday afternoon (before that).  He states that when he got home he started to feel very tired, generally weak, and subsequently developed nausea with about 8 episodes of vomiting.  Unable to keep anything down.  Abdomen feels a bit sore with this.  No other alleviating or rating factors.  States he is having some pain in his leg that is similar to his post op pain he has been having since surgery but remains quite uncomfortable to anterior lower leg, he was taking his Percocet but recently switched to Tylenol as he did not feel the Percocet was helping and he did not want to become addicted.  He denies fever, chills, diarrhea, melena, hematochezia, constipation, dysuria, dyspnea, cough, chest pain, syncope, numbness, or focal weakness.  HPI     Past Medical History:  Diagnosis Date  . ADHD   . HIV (human immunodeficiency virus infection) (HCC)   . Immune deficiency disorder [facility])     Patient Active Problem List   Diagnosis Date Noted  . 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 Healthcare maintenance 09/02/2019  . HIV disease (HCC) 08/03/2019    Past Surgical History:  Procedure Laterality Date  . ORIF TIBIA FRACTURE         Family History  Adopted: Yes    Social History   Tobacco Use  . Smoking status: Never Smoker  . Smokeless tobacco: Never Used  Vaping Use  . Vaping Use: Never used  Substance Use Topics  . Alcohol use: Yes    Comment: Occasional  . Drug use: Yes     Types: Marijuana    Comment: daily     Home Medications Prior to Admission medications   Medication Sig Start Date End Date Taking? Authorizing Provider  bictegravir-emtricitabine-tenofovir AF (BIKTARVY) 50-200-25 MG TABS tablet Take 1 tablet by mouth daily. 10/29/20   Veryl Speak, FNP  ibuprofen (ADVIL) 200 MG tablet Take 200 mg by mouth every 6 (six) hours as needed.    [provider]    Allergies    Patient has no known allergies.  Review of Systems   Review of Systems  Constitutional: Positive for fatigue. Negative for fever.  Respiratory: Negative for cough and shortness of breath.   Cardiovascular: Negative for chest pain.  Gastrointestinal: Positive for abdominal pain (soreness), nausea and vomiting. Negative for anal bleeding, blood in stool, constipation and diarrhea.  Genitourinary: Negative for dysuria.  Musculoskeletal: Positive for myalgias.  Neurological: Positive for weakness. Negative for dizziness, syncope and numbness.  All other systems reviewed and are negative.   Physical Exam Updated Vital Signs BP (!) 163/101   Pulse (!) 140   Temp 98.3 F (36.8 C) (Oral)   Resp (!) 22   Ht 5\' 6"  (1.676 m)   Wt 79.4 kg   SpO2 93%   BMI 28.25 kg/m   Physical Exam Vitals and nursing note reviewed.  Constitutional:      General: He is not in acute  distress.    Appearance: He is well-developed. He is not toxic-appearing.  HENT:     Head: Normocephalic and atraumatic.  Eyes:     General:        Right eye: No discharge.        Left eye: No discharge.     Conjunctiva/sclera: Conjunctivae normal.  Cardiovascular:     Rate and Rhythm: Regular rhythm. Tachycardia present.     Comments: Heart rate 125. 2+ symmetric DP pulses bilaterally. Pulmonary:     Effort: Pulmonary effort is normal. No respiratory distress.     Breath sounds: Normal breath sounds. No wheezing, rhonchi or rales.  Abdominal:     General: There is no distension.      Palpations: Abdomen is soft.     Tenderness: There is no abdominal tenderness. There is no guarding or rebound.  Musculoskeletal:     Cervical back: Neck supple.     Comments: Right lower extremity: 3 surgical incision sites to the right lower leg, 2 to the proximal knee region, one distally, no significant erythema, warmth, or purulent drainage.  Mild anterior lower leg tenderness to palpation.  Otherwise nontender.  No calf tenderness.  No pitting edema.  Compartments are soft.  Skin:    General: Skin is warm and dry.     Findings: No rash.  Neurological:     Mental Status: He is alert.     Comments: Clear speech. Sensation grossly intact x 4. 5/5 symmetric grip strength & strength with plantar/dorsiflexion bilaterally.   Psychiatric:        Behavior: Behavior normal.     ED Results / Procedures / Treatments   Labs (all labs ordered are listed, but only abnormal results are displayed) Labs Reviewed  COMPREHENSIVE METABOLIC PANEL - Abnormal; Notable for the following components:      Result Value   BUN 21 (*)    Calcium 10.4 (*)    Total Protein 8.7 (*)    Total Bilirubin 1.3 (*)    All other components within normal limits  URINALYSIS, ROUTINE W REFLEX MICROSCOPIC - Abnormal; Notable for the following components:   Ketones, ur 80 (*)    All other components within normal limits  LIPASE, BLOOD  CBC    EKG EKG Interpretation  Date/Time:  Sunday December 02 2020 03:29:25 EST Ventricular Rate:  101 PR Interval:    QRS Duration: 69 QT Interval:  329 QTC Calculation: 427 R Axis:   63 Text Interpretation: Sinus tachycardia Otherwise normal ecg Confirmed by Geoffery Lyons ([id number]) on 12/02/2020 3:59:59 AM   Radiology No results found.  Procedures Procedures   Medications Ordered in ED Medications  famotidine (PEPCID) IVPB 20 mg premix (20 mg Intravenous New Bag/Given 12/02/20 0554)  HYDROmorphone (DILAUDID) injection 1 mg (has no administration in time range)  ondansetron  (ZOFRAN) injection 4 mg (4 mg Intravenous Given 12/02/20 0332)  sodium chloride 0.9 % bolus 1,000 mL (0 mLs Intravenous Stopped 12/02/20 0437)  sodium chloride 0.9 % bolus 1,000 mL (1,000 mLs Intravenous New Bag/Given 12/02/20 0437)  sodium chloride 0.9 % bolus 1,000 mL (1,000 mLs Intravenous New Bag/Given 12/02/20 0542)  ketorolac (TORADOL) 15 MG/ML injection 15 mg (15 mg Intravenous Given 12/02/20 0545)    ED Course  I have reviewed the triage vital signs and the nursing notes.  Pertinent labs & imaging results that were available during my care of the patient were reviewed by me and considered in my medical decision making (see chart for  details).    MDM Rules/Calculators/A&P                          Patient presents to the ED with complaints of N/V & weakness.  Nontoxic, tachycardic on arrival. Reassuring initial exam.   Additional history obtained:  Additional history obtained from chart review & nursing note review.   Lab Tests:  I Ordered, reviewed, and interpreted labs, which included:  CBC, CMP, lipase, urinalysis: Mild hypercalcemia and elevated protein level, creatinine preserved, CBC unremarkable, ketonuria without signs of infection.  ED Course:   ED laboratory evaluation overall reassuring, suspect a degree of dehydration, receiving a total of 3 L of normal saline.  Patient's surgical site appears to be doing well, no signs of infection, compartments are soft, no edema, neurovascularly intact distally-clinically does not seem consistent with cellulitis, compartment syndrome, DVT, or arterial ischemia at this time.  His abdomen is nontender without peritoneal signs- do not suspect acute surgical abdominal process currently. Initially quite tachycardic, but improved with fluids, remains afebrile, no dyspnea/chest pain or hypoxia to suggest PE.   He is feeling better following fluids, had improvement in pain following Toradol, but remains uncomfortable- initially wanted to avoid  narcotics but now is agreeable to this, will give IV dose. Pending PO challenge.   06:40: RE-EVAL: Patient feels much better, states he is ready to go home, he is tolerating PO without difficulty. Will discharge with zofran PRN, strict return precautions, close follow up. I discussed results, treatment plan, need for follow-up, and return precautions with the patient. Provided opportunity for questions, patient confirmed understanding and is in agreement with plan.   Findings and plan of care discussed with supervising physician Dr. Judd Lien who is in agreement.   Portions of this note were generated with Scientist, clinical (histocompatibility and immunogenetics). Dictation errors may occur despite best attempts at proofreading.  Final Clinical Impression(s) / ED Diagnoses Final diagnoses:  Non-intractable vomiting with nausea, unspecified vomiting type    Rx / DC Orders ED Discharge Orders         Ordered    ondansetron (ZOFRAN ODT) 4 MG disintegrating tablet  Every 8 hours PRN        12/02/20 0555           , Pleas Koch, PA-C 12/02/20 2010    Geoffery Lyons, MD 12/03/20 [phone]

## 2020-12-02 NOTE — Discharge Instructions (Signed)
You were seen in the emergency department today for vomiting and fatigue.  Your work-up was overall reassuring.  Your blood work did show that you had some ketones in your urine as well as an elevated calcium and protein level, we would like you to these rechecked by your primary care provider.  Please take your pain medications as prescribed.  We are sending you home with a prescription for Zofran to take every 8 hours as needed for nausea and vomiting.  We have prescribed you new medication(s) today. Discuss the medications prescribed today with your pharmacist as they can have adverse effects and interactions with your other medicines including over the counter and prescribed medications. Seek medical evaluation if you start to experience new or abnormal symptoms after taking one of these medicines, seek care immediately if you start to experience difficulty breathing, feeling of your throat closing, facial swelling, or rash as these could be indications of a more serious allergic reaction  Please be sure to drink plenty of fluids and stay well-hydrated.  You were given fluids in the emergency department.  Please follow-up with your primary care provider as well as your surgeon within the next 3 days.  Return to the ER for new or worsening symptoms including but not limited to inability to keep fluids down, fever, chest pain, trouble breathing, redness/swelling/drainage to your surgical site, discoloration of your toes, numbness or tingling in your toes, passing out, or any other concerns.

## 2020-12-06 ENCOUNTER — Emergency Department (HOSPITAL_COMMUNITY): Payer: Medicaid Other

## 2020-12-06 ENCOUNTER — Emergency Department (HOSPITAL_COMMUNITY)
Admission: EM | Admit: 2020-12-06 | Discharge: 2020-12-06 | Disposition: A | Discharge status: Home / Self Care | Payer: Medicaid Other | Attending: Emergency Medicine | Admitting: Emergency Medicine

## 2020-12-06 ENCOUNTER — Encounter (HOSPITAL_COMMUNITY): Payer: Self-pay

## 2020-12-06 ENCOUNTER — Emergency Department (HOSPITAL_COMMUNITY)
Admission: EM | Admit: 2020-12-06 | Discharge: 2020-12-07 | Disposition: A | Discharge status: Home / Self Care | Payer: Medicaid Other | Source: Home / Self Care | Attending: Emergency Medicine | Admitting: Emergency Medicine

## 2020-12-06 ENCOUNTER — Emergency Department (HOSPITAL_BASED_OUTPATIENT_CLINIC_OR_DEPARTMENT_OTHER)
Admit: 2020-12-06 | Discharge: 2020-12-06 | Disposition: A | Discharge status: Home / Self Care | Payer: Medicaid Other | Attending: Emergency Medicine | Admitting: Emergency Medicine

## 2020-12-06 ENCOUNTER — Other Ambulatory Visit: Payer: Self-pay

## 2020-12-06 DIAGNOSIS — Z8781 Personal history of (healed) traumatic fracture: Secondary | ICD-10-CM | POA: Insufficient documentation

## 2020-12-06 DIAGNOSIS — M79661 Pain in right lower leg: Secondary | ICD-10-CM | POA: Insufficient documentation

## 2020-12-06 DIAGNOSIS — Z21 Asymptomatic human immunodeficiency virus [HIV] infection status: Secondary | ICD-10-CM | POA: Insufficient documentation

## 2020-12-06 DIAGNOSIS — M79606 Pain in leg, unspecified: Secondary | ICD-10-CM

## 2020-12-06 DIAGNOSIS — M79604 Pain in right leg: Secondary | ICD-10-CM | POA: Diagnosis present

## 2020-12-06 MED ORDER — HYDROMORPHONE HCL 1 MG/ML IJ SOLN
1.0000 mg | Freq: Once | INTRAMUSCULAR | Status: AC
Start: 2020-12-07 — End: 2020-12-07
  Administered 2020-12-07: 1 mg via INTRAMUSCULAR
  Filled 2020-12-06: qty 1

## 2020-12-06 MED ORDER — OXYCODONE-ACETAMINOPHEN 5-325 MG PO TABS
1.0000 | ORAL_TABLET | Freq: Once | ORAL | Status: AC
  Administered 2020-12-06: 1 via ORAL
  Filled 2020-12-06: qty 1

## 2020-12-06 NOTE — ED Provider Notes (Signed)
[facility]-EMERGENCY DEPT Provider Note   CSN: [id number] Arrival date & time: 12/06/20  2238     History Chief Complaint  Patient presents with  . Leg Pain    Gavin Spencer is a 20 y.o. male.  Patient to ED with complaint of right lower leg pain. He sustained a tibia fracture on 3/3 with orthopedic surgical repair at [facility]. He reports yesterday he woke up with new onset severe pain that was uncontrolled with his oxycodone 10 mg and Flexeril. No fever. He presented to [facility] ED earlier today for same but left prior to being seen due to wait. While here, he had a plain film x-ray and a doppler US. Both reviewed and are negative.   The history is provided by the patient. No language interpreter was used.  Leg Pain Associated symptoms: no fever        Past Medical History:  Diagnosis Date  . ADHD   . HIV (human immunodeficiency virus infection) (HCC)   . Immune deficiency disorder [facility])     Patient Active Problem List   Diagnosis Date Noted  . 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 Healthcare maintenance 09/02/2019  . HIV disease (HCC) 08/03/2019    Past Surgical History:  Procedure Laterality Date  . ORIF TIBIA FRACTURE         Family History  Adopted: Yes    Social History   Tobacco Use  . Smoking status: Never Smoker  . Smokeless tobacco: Never Used  Vaping Use  . Vaping Use: Never used  Substance Use Topics  . Alcohol use: Yes    Comment: Occasional  . Drug use: Yes    Types: Marijuana    Comment: daily     Home Medications Prior to Admission medications   Medication Sig Start Date End Date Taking? Authorizing Provider  bictegravir-emtricitabine-tenofovir AF (BIKTARVY) 50-200-25 MG TABS tablet Take 1 tablet by mouth daily. 10/29/20  Yes Veryl Speak, FNP  cyclobenzaprine (FLEXERIL) 10 MG tablet Take 1 tablet by mouth in the morning, at noon, and at bedtime. 12/01/20  Yes [provider]  ibuprofen (ADVIL) 200 MG  tablet Take 200 mg by mouth every 6 (six) hours as needed.   Yes [provider]  ondansetron (ZOFRAN ODT) 4 MG disintegrating tablet Take 1 tablet (4 mg total) by mouth every 8 (eight) hours as needed for nausea or vomiting. 12/02/20  Yes Petrucelli, Samantha R, PA-C  oxyCODONE (OXY IR/ROXICODONE) 5 MG immediate release tablet Take 1-2 tablets by mouth 2 (two) times daily as needed for pain. 12/01/20 12/08/20 Yes [provider]    Allergies    Shellfish allergy  Review of Systems   Review of Systems  Constitutional: Negative for chills and fever.  Musculoskeletal:       See HPI  Skin: Negative.  Negative for color change.  Neurological: Negative.  Negative for numbness.    Physical Exam Updated Vital Signs BP (!) 141/86   Pulse (!) 117   Temp 98.2 F (36.8 C)   Resp 17   SpO2 100%   Physical Exam Vitals and nursing note reviewed.  Constitutional:      Appearance: He is well-developed.  Pulmonary:     Effort: Pulmonary effort is normal.  Musculoskeletal:     Cervical back: Normal range of motion.     Comments: Right lower leg bandaged. Calf is soft. No distal or proximal redness. Distal pulses present. Full movement of right toes.   Skin:  General: Skin is warm and dry.  Neurological:     Mental Status: He is alert and oriented to person, place, and time.     Sensory: No sensory deficit.     ED Results / Procedures / Treatments   Labs (all labs ordered are listed, but only abnormal results are displayed) Labs Reviewed - No data to display  EKG None  Radiology DG Tibia/Fibula Right  Result Date: 12/06/2020 CLINICAL DATA:  Status post recent surgery. EXAM: RIGHT TIBIA AND FIBULA - 2 VIEW COMPARISON:  None. FINDINGS: The patient has undergone prior intramedullary nail placement through the tibia. The alignment is appropriate. The hardware appears grossly intact. There is soft tissue swelling about the right lower extremity which may be related to the  known fracture or recent surgical intervention. IMPRESSION: Expected postoperative changes as above. No evidence for an acute complicating feature. Electronically Signed   By: Katherine Mantle M.D.   On: 12/06/2020 16:43   VAS [place] LOWER EXTREMITY VENOUS (DVT)  Result Date: 12/06/2020  Lower Venous DVT Study Indications: Pain.  Risk Factors: Trauma. Limitations: Poor ultrasound/tissue interface, open wound and staples. Comparison Study: No prior studies. Performing Technologist: Chanda Busing RVT  Examination Guidelines: A complete evaluation includes B-mode imaging, spectral Doppler, color Doppler, and power Doppler as needed of all accessible portions of each vessel. Bilateral testing is considered an integral part of a complete examination. Limited examinations for reoccurring indications may be performed as noted. The reflux portion of the exam is performed with the patient in reverse Trendelenburg.  +---------+---------------+---------+-----------+----------+--------------+ RIGHT    CompressibilityPhasicitySpontaneityPropertiesThrombus Aging +---------+---------------+---------+-----------+----------+--------------+ CFV      Full           Yes      Yes                                 +---------+---------------+---------+-----------+----------+--------------+ SFJ      Full                                                        +---------+---------------+---------+-----------+----------+--------------+ FV Prox  Full                                                        +---------+---------------+---------+-----------+----------+--------------+ FV Mid   Full                                                        +---------+---------------+---------+-----------+----------+--------------+ FV DistalFull                                                        +---------+---------------+---------+-----------+----------+--------------+ PFV      Full                                                         +---------+---------------+---------+-----------+----------+--------------+  POP      Full           Yes      Yes                                 +---------+---------------+---------+-----------+----------+--------------+ PTV      Full                                                        +---------+---------------+---------+-----------+----------+--------------+ PERO     Full                                                        +---------+---------------+---------+-----------+----------+--------------+   +----+---------------+---------+-----------+----------+--------------+ LEFTCompressibilityPhasicitySpontaneityPropertiesThrombus Aging +----+---------------+---------+-----------+----------+--------------+ CFV Full           Yes      Yes                                 +----+---------------+---------+-----------+----------+--------------+     Summary: RIGHT: - There is no evidence of deep vein thrombosis in the lower extremity. However, portions of this examination were limited- see technologist comments above.  - No cystic structure found in the popliteal fossa.  LEFT: - No evidence of common femoral vein obstruction.  *See table(s) above for measurements and observations.    Preliminary     Procedures Procedures   Medications Ordered in ED Medications  HYDROmorphone (DILAUDID) injection 1 mg (has no administration in time range)    ED Course  I have reviewed the triage vital signs and the nursing notes.  Pertinent labs & imaging results that were available during my care of the patient were reviewed by me and considered in my medical decision making (see chart for details).    MDM Rules/Calculators/A&P                          Patient with recent tibial fx s/p surgical repair at [facility],  Here tonight with uncontrolled pain. He has imaging and doppler study that were negative.   On exam, the right lower leg is  neurovascularly intact. No evidence compartment syndrome.   Will provide IM Dilaudid and reassess.   Pain much improved after Dilaudid. On further recheck, the patient is sleeping soundly. VSS. Ok for discharge. He has pain medication at home as well as scheduled orthopedic follow up.   Final Clinical Impression(s) / ED Diagnoses Final diagnoses:  None   1. Right leg pain 2. History recent fracture of right leg   Rx / DC Orders ED Discharge Orders    None       Elpidio Anis, PA-C 12/07/20 0248    Glynn Octave, MD 12/07/20 [phone]

## 2020-12-06 NOTE — Progress Notes (Signed)
Right lower extremity venous duplex has been completed. Preliminary results can be found in CV Proc through chart review.  Results were given to Dr. Stevie Kern.  12/06/20 4:37 PM Olen Cordial RVT

## 2020-12-06 NOTE — ED Triage Notes (Signed)
Pt c/o right leg pain. Recent Tibia surgery. Seen here earlier for pain control. Pain returned and home medications not helping.

## 2020-12-06 NOTE — ED Notes (Signed)
X-ray at bedside

## 2020-12-06 NOTE — ED Triage Notes (Signed)
Pt presents with c/o right leg pain. Pt reports he broke his tibia 8 days ago, had a fall last night onto that leg and reports that his staples went into his leg, causing pain. Pt reports he did call the surgeon and they told him they would see him at his follow-up appointment next Tuesday. Pt has leg wrapped.

## 2020-12-06 NOTE — Discharge Instructions (Addendum)
Please follow-up with your orthopedic surgeon as discussed.  If you have worsening pain, swelling, redness, further falls or other new concerning symptom, return to ER for reassessment.

## 2020-12-07 NOTE — Discharge Instructions (Addendum)
Continue your current medications for pain. Keep your scheduled appointment with orthopedics next week for fracture/surgery follow up.

## 2020-12-07 NOTE — ED Provider Notes (Signed)
[facility]-EMERGENCY DEPT Provider Note   CSN: [id number] Arrival date & time: 12/06/20  1349     History Chief Complaint  Patient presents with  . Leg Pain    Gavin Spencer is a 20 y.o. male.  Presents to ER with concern for right leg pain.  Patient recently underwent tibia fracture surgery at facility in [place].  Reports no complications with the surgery to his knowledge.  Hit his knee against something earlier today and is concerned that this may have damaged one of the staples.  States that he is having pain across his lower leg.  Relatively constant, currently mild to moderate.  Has had some mild swelling ever since the original injury.  No significant change in the mild swelling today.  No redness, fevers.  HPI     Past Medical History:  Diagnosis Date  . ADHD   . HIV (human immunodeficiency virus infection) (HCC)   . Immune deficiency disorder [facility])     Patient Active Problem List   Diagnosis Date Noted  . 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 Healthcare maintenance 09/02/2019  . HIV disease (HCC) 08/03/2019    Past Surgical History:  Procedure Laterality Date  . ORIF TIBIA FRACTURE         Family History  Adopted: Yes    Social History   Tobacco Use  . Smoking status: Never Smoker  . Smokeless tobacco: Never Used  Vaping Use  . Vaping Use: Never used  Substance Use Topics  . Alcohol use: Yes    Comment: Occasional  . Drug use: Yes    Types: Marijuana    Comment: daily     Home Medications Prior to Admission medications   Medication Sig Start Date End Date Taking? Authorizing Provider  bictegravir-emtricitabine-tenofovir AF (BIKTARVY) 50-200-25 MG TABS tablet Take 1 tablet by mouth daily. 10/29/20   Veryl Speak, FNP  cyclobenzaprine (FLEXERIL) 10 MG tablet Take 1 tablet by mouth in the morning, at noon, and at bedtime. 12/01/20   [provider]  ibuprofen (ADVIL) 200 MG tablet Take 200 mg by mouth every 6  (six) hours as needed.    [provider]  ondansetron (ZOFRAN ODT) 4 MG disintegrating tablet Take 1 tablet (4 mg total) by mouth every 8 (eight) hours as needed for nausea or vomiting. 12/02/20   Petrucelli, Samantha R, PA-C  oxyCODONE (OXY IR/ROXICODONE) 5 MG immediate release tablet Take 1-2 tablets by mouth 2 (two) times daily as needed for pain. 12/01/20 12/08/20  [provider]    Allergies    Shellfish allergy  Review of Systems   Review of Systems  Constitutional: Negative for chills and fever.  HENT: Negative for ear pain and sore throat.   Eyes: Negative for pain and visual disturbance.  Respiratory: Negative for cough and shortness of breath.   Cardiovascular: Positive for leg swelling. Negative for chest pain and palpitations.  Gastrointestinal: Negative for abdominal pain and vomiting.  Genitourinary: Negative for dysuria and hematuria.  Musculoskeletal: Positive for arthralgias. Negative for back pain.  Skin: Negative for color change and rash.  Neurological: Negative for seizures and syncope.  All other systems reviewed and are negative.   Physical Exam Updated Vital Signs BP (!) 162/90   Pulse (!) 102   Temp 98.3 F (36.8 C) (Oral)   Resp 18   Ht 5\' 6"  (1.676 m)   Wt 74.8 kg   SpO2 99%   BMI 26.63 kg/m   Physical Exam  Vitals and nursing note reviewed.  Constitutional:      Appearance: He is well-developed.  HENT:     Head: Normocephalic and atraumatic.  Eyes:     Conjunctiva/sclera: Conjunctivae normal.  Cardiovascular:     Rate and Rhythm: Normal rate.     Pulses: Normal pulses.  Pulmonary:     Effort: Pulmonary effort is normal. No respiratory distress.  Abdominal:     Palpations: Abdomen is soft.     Tenderness: There is no abdominal tenderness.  Musculoskeletal:     Cervical back: Neck supple.     Comments: RLE: Cherlynn Polo are all intact and undamaged, leg has mild swelling, compartments soft, dp/pt pulse intact  Skin:     General: Skin is warm and dry.  Neurological:     Mental Status: He is alert.     ED Results / Procedures / Treatments   Labs (all labs ordered are listed, but only abnormal results are displayed) Labs Reviewed - No data to display  EKG None  Radiology DG Tibia/Fibula Right  Result Date: 12/06/2020 CLINICAL DATA:  Status post recent surgery. EXAM: RIGHT TIBIA AND FIBULA - 2 VIEW COMPARISON:  None. FINDINGS: The patient has undergone prior intramedullary nail placement through the tibia. The alignment is appropriate. The hardware appears grossly intact. There is soft tissue swelling about the right lower extremity which may be related to the known fracture or recent surgical intervention. IMPRESSION: Expected postoperative changes as above. No evidence for an acute complicating feature. Electronically Signed   By: Katherine Mantle M.D.   On: 12/06/2020 16:43   VAS [place] LOWER EXTREMITY VENOUS (DVT)  Result Date: 12/06/2020  Lower Venous DVT Study Indications: Pain.  Risk Factors: Trauma. Limitations: Poor ultrasound/tissue interface, open wound and staples. Comparison Study: No prior studies. Performing Technologist: Chanda Busing RVT  Examination Guidelines: A complete evaluation includes B-mode imaging, spectral Doppler, color Doppler, and power Doppler as needed of all accessible portions of each vessel. Bilateral testing is considered an integral part of a complete examination. Limited examinations for reoccurring indications may be performed as noted. The reflux portion of the exam is performed with the patient in reverse Trendelenburg.  +---------+---------------+---------+-----------+----------+--------------+ RIGHT    CompressibilityPhasicitySpontaneityPropertiesThrombus Aging +---------+---------------+---------+-----------+----------+--------------+ CFV      Full           Yes      Yes                                  +---------+---------------+---------+-----------+----------+--------------+ SFJ      Full                                                        +---------+---------------+---------+-----------+----------+--------------+ FV Prox  Full                                                        +---------+---------------+---------+-----------+----------+--------------+ FV Mid   Full                                                        +---------+---------------+---------+-----------+----------+--------------+  FV DistalFull                                                        +---------+---------------+---------+-----------+----------+--------------+ PFV      Full                                                        +---------+---------------+---------+-----------+----------+--------------+ POP      Full           Yes      Yes                                 +---------+---------------+---------+-----------+----------+--------------+ PTV      Full                                                        +---------+---------------+---------+-----------+----------+--------------+ PERO     Full                                                        +---------+---------------+---------+-----------+----------+--------------+   +----+---------------+---------+-----------+----------+--------------+ LEFTCompressibilityPhasicitySpontaneityPropertiesThrombus Aging +----+---------------+---------+-----------+----------+--------------+ CFV Full           Yes      Yes                                 +----+---------------+---------+-----------+----------+--------------+     Summary: RIGHT: - There is no evidence of deep vein thrombosis in the lower extremity. However, portions of this examination were limited- see technologist comments above.  - No cystic structure found in the popliteal fossa.  LEFT: - No evidence of common femoral vein obstruction.  *See table(s)  above for measurements and observations.    Preliminary     Procedures Procedures   Medications Ordered in ED Medications  oxyCODONE-acetaminophen (PERCOCET/ROXICET) 5-325 MG per tablet 1 tablet (1 tablet Oral Given 12/06/20 1603)    ED Course  I have reviewed the triage vital signs and the nursing notes.  Pertinent labs & imaging results that were available during my care of the patient were reviewed by me and considered in my medical decision making (see chart for details).    MDM Rules/Calculators/A&P                         [age] year old male presents to ER with concern for right leg pain, swelling in setting of recent surgery for a midshaft tibia fracture.  On exam, leg appears well, noted modest swelling but compartments soft, pulses intact, no significant erythema, no purulence noted.  X-ray shows expected findings postoperatively.  DVT study negative.  Recommended he follow[place] with [place] in [place].  Discharged home.  After the discussed management above, the patient  was determined to be safe for discharge.  The patient was in agreement with this plan and all questions regarding their care were answered.  ED return precautions were discussed and the patient will return to the ED with any significant worsening of condition.    Final Clinical Impression(s) / ED Diagnoses Final diagnoses:  Pain of lower extremity, unspecified laterality    Rx / DC Orders ED Discharge Orders    None       Milagros Loll, MD 12/07/20 [phone]

## 2020-12-10 NOTE — Telephone Encounter (Signed)
Received call from [place] at [facility] wanting to confirm that patient was treated for positive RPR. RN confirmed that patient was treated with 2.4 million units Bicillin IM once on 11/05/20. She has been unable to get in contact with patient, RN sent patient MyChart message requesting him to cal her back.   Sandie Ano, RN

## 2021-02-18 ENCOUNTER — Other Ambulatory Visit: Payer: Self-pay | Admitting: Family

## 2021-02-18 ENCOUNTER — Ambulatory Visit (INDEPENDENT_AMBULATORY_CARE_PROVIDER_SITE_OTHER): Payer: Medicaid Other | Admitting: Family

## 2021-02-18 ENCOUNTER — Other Ambulatory Visit: Payer: Self-pay

## 2021-02-18 ENCOUNTER — Encounter: Payer: Self-pay | Admitting: Family

## 2021-02-18 VITALS — BP 133/83 | HR 73 | Temp 97.8°F | Wt 172.0 lb

## 2021-02-18 DIAGNOSIS — A515 Early syphilis, latent: Secondary | ICD-10-CM | POA: Diagnosis not present

## 2021-02-18 DIAGNOSIS — B20 Human immunodeficiency virus [HIV] disease: Secondary | ICD-10-CM

## 2021-02-18 DIAGNOSIS — Z0000 Encounter for general adult medical examination without abnormal findings: Secondary | ICD-10-CM | POA: Diagnosis not present

## 2021-02-18 MED ORDER — BIKTARVY 50-200-25 MG PO TABS: 1.0000 | ORAL_TABLET | Freq: Every day | ORAL | 5 refills | Status: DC

## 2021-02-18 NOTE — Progress Notes (Signed)
Patient ID: Gavin Spencer, male    DOB: 26-Dec-2000, 19 y.o.   MRN: [id number]  Subjective:    Chief Complaint  Patient presents with  . HIV Positive/AIDS   HPI:  Gavin Spencer is a 20 y.o. male with HIV last seen on 10/29/2020 with well-controlled virus and good adherence and tolerance to his ART regimen of Biktarvy.  Viral load at the time was undetectable with CD4 count of 534.  RPR titer 1: 128 which was treated with 2,400,000 units of Bicillin once.  Here today for routine follow-up.  Gavin Spencer continues to take his Biktarvy daily as prescribed with no adverse side effects or missed doses since his last office visit.  Overall feeling well today with no new concerns/complaints.  Continuing to recover from previous fracture of his leg in March. Denies fevers, chills, night sweats, headaches, changes in vision, neck pain/stiffness, nausea, diarrhea, vomiting, lesions or rashes.  Gavin Spencer has no problems obtaining medication from the pharmacy remains covered through [facility].  Denies feelings of being down, depressed, or hopeless recently.  No recreational or illicit drug use, tobacco use, or alcohol consumption.  Condoms provided.  COVID vaccinated and due for booster.  Routine dental care is up-to-date per recommendations.  Healthcare maintenance due includes Prevnar.   Allergies  Allergen Reactions  . Shellfish Allergy Anaphylaxis    Seafood       Outpatient Medications Prior to Visit  Medication Sig Dispense Refill  . bictegravir-emtricitabine-tenofovir AF (BIKTARVY) 50-200-25 MG TABS tablet Take 1 tablet by mouth daily. 30 tablet 5  . cyclobenzaprine (FLEXERIL) 10 MG tablet Take 1 tablet by mouth in the morning, at noon, and at bedtime. (Patient not taking: Reported on 02/18/2021)    . ibuprofen (ADVIL) 200 MG tablet Take 200 mg by mouth every 6 (six) hours as needed. (Patient not taking: Reported on 02/18/2021)    . ondansetron (ZOFRAN ODT) 4 MG disintegrating tablet  Take 1 tablet (4 mg total) by mouth every 8 (eight) hours as needed for nausea or vomiting. (Patient not taking: Reported on 02/18/2021) 10 tablet 0   No facility-administered medications prior to visit.     Past Medical History:  Diagnosis Date  . ADHD   . HIV (human immunodeficiency virus infection) (HCC)   . Immune deficiency disorder [facility])      Past Surgical History:  Procedure Laterality Date  . ORIF TIBIA FRACTURE         Review of Systems  Constitutional: Negative for appetite change, chills, fatigue, fever and unexpected weight change.  Eyes: Negative for visual disturbance.  Respiratory: Negative for cough, chest tightness, shortness of breath and wheezing.   Cardiovascular: Negative for chest pain and leg swelling.  Gastrointestinal: Negative for abdominal pain, constipation, diarrhea, nausea and vomiting.  Genitourinary: Negative for dysuria, flank pain, frequency, genital sores, hematuria and urgency.  Skin: Negative for rash.  Allergic/Immunologic: Negative for immunocompromised state.  Neurological: Negative for dizziness and headaches.      Objective:    BP 133/83   Pulse 73   Temp 97.8 F (36.6 C) (Oral)   Wt 172 lb (78 kg)   BMI 27.76 kg/m  Nursing note and vital signs reviewed.  Physical Exam Constitutional:      General: He is not in acute distress.    Appearance: He is well-developed.  Eyes:     Conjunctiva/sclera: Conjunctivae normal.  Cardiovascular:     Rate and Rhythm: Normal rate and regular rhythm.  Heart sounds: Normal heart sounds. No murmur heard. No friction rub. No gallop.   Pulmonary:     Effort: Pulmonary effort is normal. No respiratory distress.     Breath sounds: Normal breath sounds. No wheezing or rales.  Chest:     Chest wall: No tenderness.  Abdominal:     General: Bowel sounds are normal.     Palpations: Abdomen is soft.     Tenderness: There is no abdominal tenderness.  Musculoskeletal:     Cervical back:  Neck supple.  Lymphadenopathy:     Cervical: No cervical adenopathy.  Skin:    General: Skin is warm and dry.     Findings: No rash.  Neurological:     Mental Status: He is alert and oriented to person, place, and time.  Psychiatric:        Behavior: Behavior normal.        Thought Content: Thought content normal.        Judgment: Judgment normal.      Depression screen [facility] 2/9 02/18/2021 07/30/2020 09/02/2019 08/03/2019  Decreased Interest 0 0 0 0  Down, Depressed, Hopeless 0 0 0 0  PHQ - 2 Score 0 0 0 0       Assessment & Plan:    Patient Active Problem List   Diagnosis Date Noted  . 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 Healthcare maintenance 09/02/2019  . HIV disease (HCC) 08/03/2019     Problem List Items Addressed This Visit      Other   HIV disease (HCC) - Primary    Gavin Spencer continues to have well-controlled HIV disease with good adherence and tolerance to his ART regimen of Biktarvy.  No signs/symptoms of opportunistic infection.  Reviewed previous lab work and discussed plan of care.  Check blood work today.  Continue current dose of Biktarvy.  Plan for follow-up in 4 months or sooner if needed with lab work on the same day.      Relevant Medications   bictegravir-emtricitabine-tenofovir AF (BIKTARVY) 50-200-25 MG TABS tablet   Other Relevant Orders   COMPLETE METABOLIC PANEL WITH GFR   HIV-1 RNA quant-no reflex-bld   T-helper cell (CD4)- ([facility] clinic only)   Healthcare maintenance     Discussed importance of safe sexual practice to reduce risk of STI.  Condoms provided.  COVID vaccinated and due for booster.  Routine dental care up-to-date per recommendations.  Declines vaccines today.      Early syphilis, latent    Gavin Spencer is status post treatment early latent syphilis with 2,400,000 units of Bicillin once.  Recheck RPR today.  No current symptoms.  Discussed importance of safe sexual practice to reduce risk of STI.      Relevant  Medications   bictegravir-emtricitabine-tenofovir AF (BIKTARVY) 50-200-25 MG TABS tablet   Other Relevant Orders   RPR       I have discontinued Gavin Spencer's ibuprofen, ondansetron, and cyclobenzaprine. I am also having him maintain his Biktarvy.   Meds ordered this encounter  Medications  . bictegravir-emtricitabine-tenofovir AF (BIKTARVY) 50-200-25 MG TABS tablet    Sig: Take 1 tablet by mouth daily.    Dispense:  30 tablet    Refill:  5    Order Specific Question:   Supervising Provider    Answer:   Judyann Munson [[id number]]     Follow-up: Return in about 4 months (around 06/21/2021), or if symptoms worsen or fail to improve.   Marcos Eke, MSN, FNP-C Nurse Practitioner [facility] [place] number: [phone]

## 2021-02-18 NOTE — Patient Instructions (Addendum)
Nice to see you.   We will check your lab work today.   Continue to take your [place] daily.  Refills have been sent to the pharmacy.  Plan for follow up in 4 months or sooner if needed with lab work on the same day.  Have a great summer and stay safe!

## 2021-02-18 NOTE — Assessment & Plan Note (Signed)
Mr. Gilmer continues to have well-controlled HIV disease with good adherence and tolerance to his ART regimen of Biktarvy.  No signs/symptoms of opportunistic infection.  Reviewed previous lab work and discussed plan of care.  Check blood work today.  Continue current dose of Biktarvy.  Plan for follow-up in 4 months or sooner if needed with lab work on the same day.

## 2021-02-18 NOTE — Assessment & Plan Note (Signed)
Gavin Spencer is status post treatment early latent syphilis with 2,400,000 units of Bicillin once.  Recheck RPR today.  No current symptoms.  Discussed importance of safe sexual practice to reduce risk of STI.

## 2021-02-18 NOTE — Assessment & Plan Note (Signed)
   Discussed importance of safe sexual practice to reduce risk of STI.  Condoms provided.  COVID vaccinated and due for booster.  Routine dental care up-to-date per recommendations.  Declines vaccines today.

## 2021-02-19 LAB — T-HELPER CELL (CD4) - (RCID CLINIC ONLY)
CD4 % Helper T Cell: 29 % — ABNORMAL LOW (ref 33–65)
CD4 T Cell Abs: 700 /uL (ref 400–1790)

## 2021-02-20 LAB — RPR: RPR Ser Ql: REACTIVE — AB

## 2021-02-20 LAB — COMPLETE METABOLIC PANEL WITH GFR
AG Ratio: 1.9 (calc) (ref 1.0–2.5)
ALT: 19 U/L (ref 9–46)
AST: 19 U/L (ref 10–40)
Albumin: 5.2 g/dL — ABNORMAL HIGH (ref 3.6–5.1)
Alkaline phosphatase (APISO): 105 U/L (ref 36–130)
BUN: 18 mg/dL (ref 7–25)
CO2: 29 mmol/L (ref 20–32)
Calcium: 10.7 mg/dL — ABNORMAL HIGH (ref 8.6–10.3)
Chloride: 103 mmol/L (ref 98–110)
Creat: 1.18 mg/dL (ref 0.60–1.35)
GFR, Est African American: 102 mL/min/{1.73_m2} (ref >=60)
GFR, Est Non African American: 88 mL/min/{1.73_m2} (ref >=60)
Globulin: 2.7 g/dL (calc) (ref 1.9–3.7)
Glucose, Bld: 84 mg/dL (ref 65–99)
Potassium: 4.5 mmol/L (ref 3.5–5.3)
Sodium: 140 mmol/L (ref 135–146)
Total Bilirubin: 0.5 mg/dL (ref 0.2–1.2)
Total Protein: 7.9 g/dL (ref 6.1–8.1)

## 2021-02-20 LAB — FLUORESCENT TREPONEMAL AB(FTA)-IGG-BLD: Fluorescent Treponemal ABS: REACTIVE — AB

## 2021-02-20 LAB — HIV-1 RNA QUANT-NO REFLEX-BLD
HIV 1 RNA Quant: NOT DETECTED Copies/mL
HIV-1 RNA Quant, Log: NOT DETECTED Log cps/mL

## 2021-02-20 LAB — RPR TITER: RPR Titer: 1:8 {titer} — ABNORMAL HIGH

## 2021-06-10 ENCOUNTER — Encounter: Payer: Self-pay | Admitting: Family

## 2021-06-10 ENCOUNTER — Ambulatory Visit (INDEPENDENT_AMBULATORY_CARE_PROVIDER_SITE_OTHER): Payer: Medicaid Other | Admitting: Family

## 2021-06-10 ENCOUNTER — Other Ambulatory Visit (HOSPITAL_COMMUNITY)
Admission: RE | Admit: 2021-06-10 | Discharge: 2021-06-10 | Disposition: A | Discharge status: Home / Self Care | Payer: Medicaid Other | Source: Ambulatory Visit | Attending: Family | Admitting: Family

## 2021-06-10 ENCOUNTER — Other Ambulatory Visit: Payer: Self-pay

## 2021-06-10 VITALS — BP 126/78 | HR 93 | Temp 97.7°F | Ht 66.0 in | Wt 171.0 lb

## 2021-06-10 DIAGNOSIS — A539 Syphilis, unspecified: Secondary | ICD-10-CM | POA: Diagnosis not present

## 2021-06-10 DIAGNOSIS — Z113 Encounter for screening for infections with a predominantly sexual mode of transmission: Secondary | ICD-10-CM | POA: Diagnosis not present

## 2021-06-10 DIAGNOSIS — A515 Early syphilis, latent: Secondary | ICD-10-CM | POA: Diagnosis not present

## 2021-06-10 DIAGNOSIS — B20 Human immunodeficiency virus [HIV] disease: Secondary | ICD-10-CM

## 2021-06-10 DIAGNOSIS — Z0000 Encounter for general adult medical examination without abnormal findings: Secondary | ICD-10-CM | POA: Diagnosis not present

## 2021-06-10 MED ORDER — BIKTARVY 50-200-25 MG PO TABS: 1.0000 | ORAL_TABLET | Freq: Every day | ORAL | 5 refills | Status: DC

## 2021-06-10 NOTE — Patient Instructions (Addendum)
Nice to see you.  We will check your lab work today.  Continue to take your medications  Refills have been sent to the pharmacy.  Plan for follow up in 4 months or sooner if needed with lab work on the same day.  Have a great semester and stay safe!

## 2021-06-10 NOTE — Progress Notes (Signed)
Brief Narrative   Patient ID: Gavin Spencer, male    DOB: March 27, 2001, 20 y.o.   MRN: [id number]  [name] is a 20 y/o AA male diagnosed with HIV disease diagnosed on 07/28/19 with risk factor of MSM. Initial viral load was 224,000 and CD4 count of 394. Genotype was without significant resistance. Entered care at [facility]. [id number] negative. No history of opportunistic infection. Sole medication regimen of Biktarvy.   Subjective:    Chief Complaint  Patient presents with   Follow-up    Given condoms     HPI:  Gavin Spencer is a 20 y.o. male with HIV disease last seen on 02/18/2021 with well-controlled virus and good adherence and tolerance to his ART regimen of Biktarvy.  Viral load was undetectable with CD4 count of 700.  RPR improved from 1: 128 down to 1: 8 indicating successful treatment of syphilis.  Here today for routine follow-up.  [name] continues to take his Biktarvy daily as prescribed with no adverse side effects.  Overall feeling well today with no new concerns/complaints. Denies fevers, chills, night sweats, headaches, changes in vision, neck pain/stiffness, nausea, diarrhea, vomiting, lesions or rashes.  Mr. Branch has no problems obtaining medication from the pharmacy and remains covered through [facility].  Denies feelings of being down, depressed, or hopeless recently.  Uses marijuana and alcohol socially with no tobacco use.  Condoms offered and provided.  Interested in STI testing.  Declines vaccines.   Allergies  Allergen Reactions   Shellfish Allergy Anaphylaxis    Seafood       Outpatient Medications Prior to Visit  Medication Sig Dispense Refill   bictegravir-emtricitabine-tenofovir AF (BIKTARVY) 50-200-25 MG TABS tablet Take 1 tablet by mouth daily. 30 tablet 5   No facility-administered medications prior to visit.     Past Medical History:  Diagnosis Date   ADHD    HIV (human immunodeficiency virus infection) (HCC)    Immune  deficiency disorder (HCC)      Past Surgical History:  Procedure Laterality Date   ORIF TIBIA FRACTURE        Review of Systems  Constitutional:  Negative for appetite change, chills, fatigue, fever and unexpected weight change.  Eyes:  Negative for visual disturbance.  Respiratory:  Negative for cough, chest tightness, shortness of breath and wheezing.   Cardiovascular:  Negative for chest pain and leg swelling.  Gastrointestinal:  Negative for abdominal pain, constipation, diarrhea, nausea and vomiting.  Genitourinary:  Negative for dysuria, flank pain, frequency, genital sores, hematuria and urgency.  Skin:  Negative for Spencer.  Allergic/Immunologic: Negative for immunocompromised state.  Neurological:  Negative for dizziness and headaches.     Objective:    BP 126/78   Pulse 93   Temp 97.7 F (36.5 C) (Oral)   Ht 5\' 6"  (1.676 m)   Wt 171 lb (77.6 kg)   SpO2 99%   BMI 27.60 kg/m  Nursing note and vital signs reviewed.  Physical Exam Constitutional:      General: He is not in acute distress.    Appearance: He is well-developed.  Eyes:     Conjunctiva/sclera: Conjunctivae normal.  Cardiovascular:     Rate and Rhythm: Normal rate and regular rhythm.     Heart sounds: Normal heart sounds. No murmur heard.   No friction rub. No gallop.  Pulmonary:     Effort: Pulmonary effort is normal. No respiratory distress.     Breath sounds: Normal breath sounds. No wheezing or  rales.  Chest:     Chest wall: No tenderness.  Abdominal:     General: Bowel sounds are normal.     Palpations: Abdomen is soft.     Tenderness: There is no abdominal tenderness.  Musculoskeletal:     Cervical back: Neck supple.  Lymphadenopathy:     Cervical: No cervical adenopathy.  Skin:    General: Skin is warm and dry.     Findings: No Spencer.  Neurological:     Mental Status: He is alert and oriented to person, place, and time.  Psychiatric:        Behavior: Behavior normal.        Thought  Content: Thought content normal.        Judgment: Judgment normal.     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 Withrow continues to have well-controlled virus with good adherence and tolerance to his ART regimen of Biktarvy.  No signs/symptoms of opportunistic infection.  We reviewed previous lab work and discussed plan of care.  Continue current dose of Biktarvy.  Check blood work today.  Plan for follow-up in 4 months or sooner if needed.      Relevant Medications   bictegravir-emtricitabine-tenofovir AF (BIKTARVY) 50-200-25 MG TABS tablet   Other Relevant Orders   COMPLETE METABOLIC PANEL WITH GFR   HIV-1 RNA quant-no reflex-bld   T-helper cell (CD4)- ([facility] clinic only)   Healthcare maintenance - Primary    Discussed importance of safe sexual practices and condom usage.  Condoms offered and provided. Declines vaccines.      Early syphilis, latent    Syphilis successfully treated with titer down to 1: 8 from 1: 128.  No current symptoms.  Recheck blood work today.  Continue to monitor.      Relevant Medications   bictegravir-emtricitabine-tenofovir AF (BIKTARVY) 50-200-25 MG TABS tablet   Other Visit Diagnoses     Syphilis       Relevant Medications   bictegravir-emtricitabine-tenofovir AF (BIKTARVY) 50-200-25 MG TABS tablet   Other Relevant Orders   RPR   Screening examination for venereal disease       Relevant Orders   Cytology (oral, anal, urethral) ancillary only   Cytology (oral, anal, urethral) ancillary only   Urine cytology ancillary only([place])         I am having Gavin Spencer maintain his Biktarvy.   Meds ordered this encounter  Medications   bictegravir-emtricitabine-tenofovir AF (BIKTARVY) 50-200-25 MG TABS tablet    Sig: Take 1 tablet by mouth daily.    Dispense:  30 tablet    Refill:  5    Order Specific Question:   Supervising Provider    Answer:   Judyann Munson [[id number]]     Follow-up: Return in about 4 months (around 10/10/2021), or if symptoms worsen or fail to improve.   Marcos Eke, MSN, FNP-C Nurse Practitioner [facility] for Infectious Disease [facility] Main number: [phone]

## 2021-06-10 NOTE — Assessment & Plan Note (Signed)
Gavin Spencer continues to have well-controlled virus with good adherence and tolerance to his ART regimen of Biktarvy.  No signs/symptoms of opportunistic infection.  We reviewed previous lab work and discussed plan of care.  Continue current dose of Biktarvy.  Check blood work today.  Plan for follow-up in 4 months or sooner if needed.

## 2021-06-10 NOTE — Assessment & Plan Note (Signed)
Syphilis successfully treated with titer down to 1: 8 from 1: 128.  No current symptoms.  Recheck blood work today.  Continue to monitor.

## 2021-06-10 NOTE — Assessment & Plan Note (Signed)
   Discussed importance of safe sexual practices and condom usage.  Condoms offered and provided.  Declines vaccines.

## 2021-06-11 LAB — CYTOLOGY, (ORAL, ANAL, URETHRAL) ANCILLARY ONLY
Chlamydia: NEGATIVE
Chlamydia: NEGATIVE
Comment: NEGATIVE
Comment: NEGATIVE
Comment: NORMAL
Comment: NORMAL
Neisseria Gonorrhea: NEGATIVE
Neisseria Gonorrhea: NEGATIVE

## 2021-06-11 LAB — URINE CYTOLOGY ANCILLARY ONLY
Chlamydia: NEGATIVE
Comment: NEGATIVE
Comment: NORMAL
Neisseria Gonorrhea: NEGATIVE

## 2021-06-11 LAB — T-HELPER CELL (CD4) - (RCID CLINIC ONLY)
CD4 % Helper T Cell: 34 % (ref 33–65)
CD4 T Cell Abs: 890 /uL (ref 400–1790)

## 2021-06-12 ENCOUNTER — Telehealth: Payer: Medicaid Other

## 2021-06-12 LAB — COMPLETE METABOLIC PANEL WITH GFR
AG Ratio: 2 (calc) (ref 1.0–2.5)
ALT: 13 U/L (ref 9–46)
AST: 17 U/L (ref 10–40)
Albumin: 5.2 g/dL — ABNORMAL HIGH (ref 3.6–5.1)
Alkaline phosphatase (APISO): 78 U/L (ref 36–130)
BUN: 13 mg/dL (ref 7–25)
CO2: 28 mmol/L (ref 20–32)
Calcium: 10.6 mg/dL — ABNORMAL HIGH (ref 8.6–10.3)
Chloride: 103 mmol/L (ref 98–110)
Creat: 1.13 mg/dL (ref 0.60–1.24)
Globulin: 2.6 g/dL (calc) (ref 1.9–3.7)
Glucose, Bld: 84 mg/dL (ref 65–99)
Potassium: 4 mmol/L (ref 3.5–5.3)
Sodium: 139 mmol/L (ref 135–146)
Total Bilirubin: 0.6 mg/dL (ref 0.2–1.2)
Total Protein: 7.8 g/dL (ref 6.1–8.1)
eGFR: 95 mL/min/{1.73_m2} (ref >=60)

## 2021-06-12 LAB — RPR TITER: RPR Titer: 1:4 {titer} — ABNORMAL HIGH

## 2021-06-12 LAB — HIV-1 RNA QUANT-NO REFLEX-BLD
HIV 1 RNA Quant: NOT DETECTED Copies/mL
HIV-1 RNA Quant, Log: NOT DETECTED Log cps/mL

## 2021-06-12 LAB — RPR: RPR Ser Ql: REACTIVE — AB

## 2021-06-12 LAB — FLUORESCENT TREPONEMAL AB(FTA)-IGG-BLD: Fluorescent Treponemal ABS: REACTIVE — AB

## 2021-06-13 ENCOUNTER — Telehealth: Payer: Self-pay

## 2021-06-13 NOTE — Telephone Encounter (Signed)
duplicate

## 2021-06-13 NOTE — Telephone Encounter (Signed)
Called patient regarding mychart message. Patient is currently uninsured and only has 7 days worth of medication. Is scheduled to see Artist tomorrow.  During call patient disclosed he is currently unemployeed and homeless. Will have patient meet [facility] tomorrow if possible.  Juanita Laster, RMA

## 2021-06-14 ENCOUNTER — Ambulatory Visit: Payer: Medicaid Other

## 2021-06-14 ENCOUNTER — Other Ambulatory Visit (HOSPITAL_COMMUNITY): Payer: Self-pay

## 2021-06-14 ENCOUNTER — Telehealth: Payer: Self-pay

## 2021-06-14 NOTE — Telephone Encounter (Signed)
Patient have [place] [employer] and his Biktarvy will be $4.00

## 2021-06-14 NOTE — Telephone Encounter (Signed)
Error

## 2021-06-14 NOTE — Telephone Encounter (Signed)
Called patient regarding message from Lupita Leash. Patient would like to go ahead and cancel his appt to see Artist. Will call office back if he has any issues getting his medication.

## 2021-07-22 IMAGING — DX DG TIBIA/FIBULA 2V*R*
2 series · 2 of 2 positions shown · non-contrast
Comparison: None.

CLINICAL DATA: Status post recent surgery.

EXAM:
RIGHT TIBIA AND FIBULA - 2 VIEW

[tibia ap]
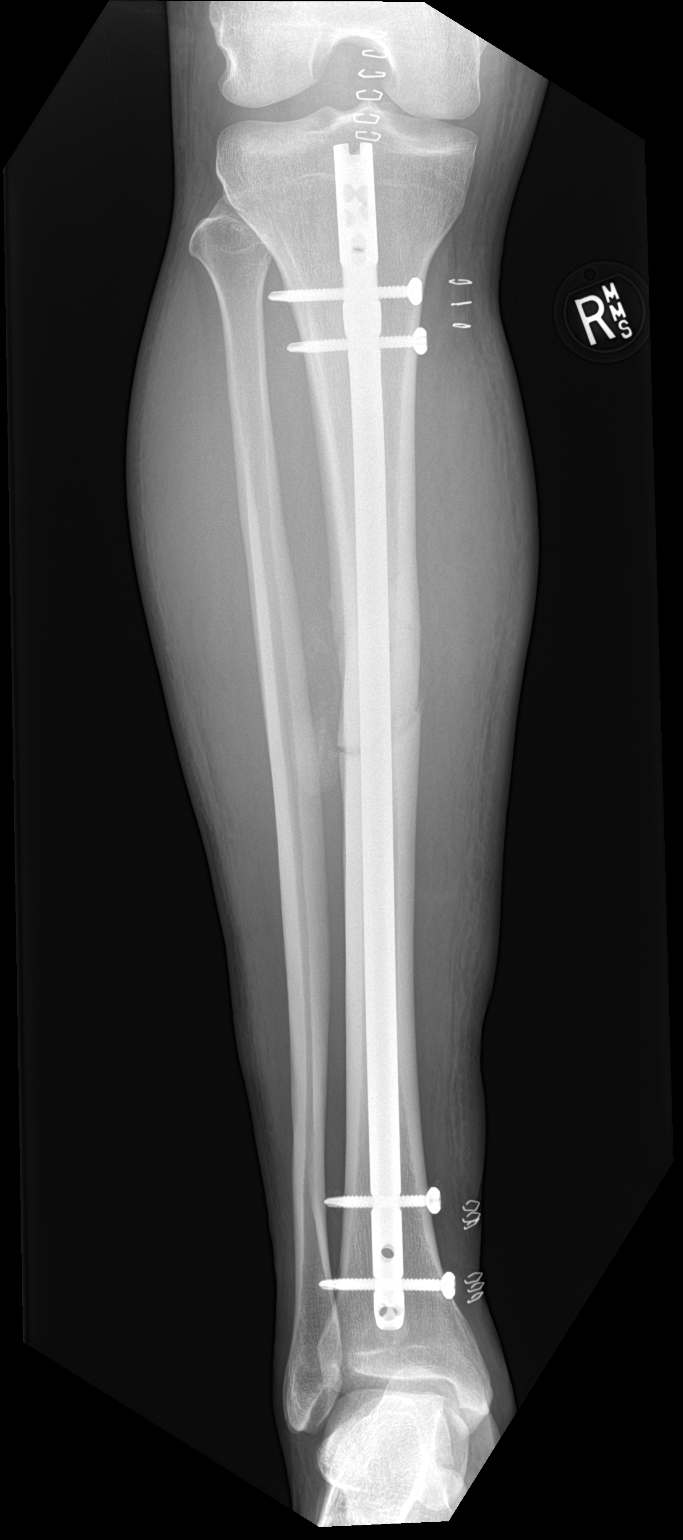

[tibia lat]
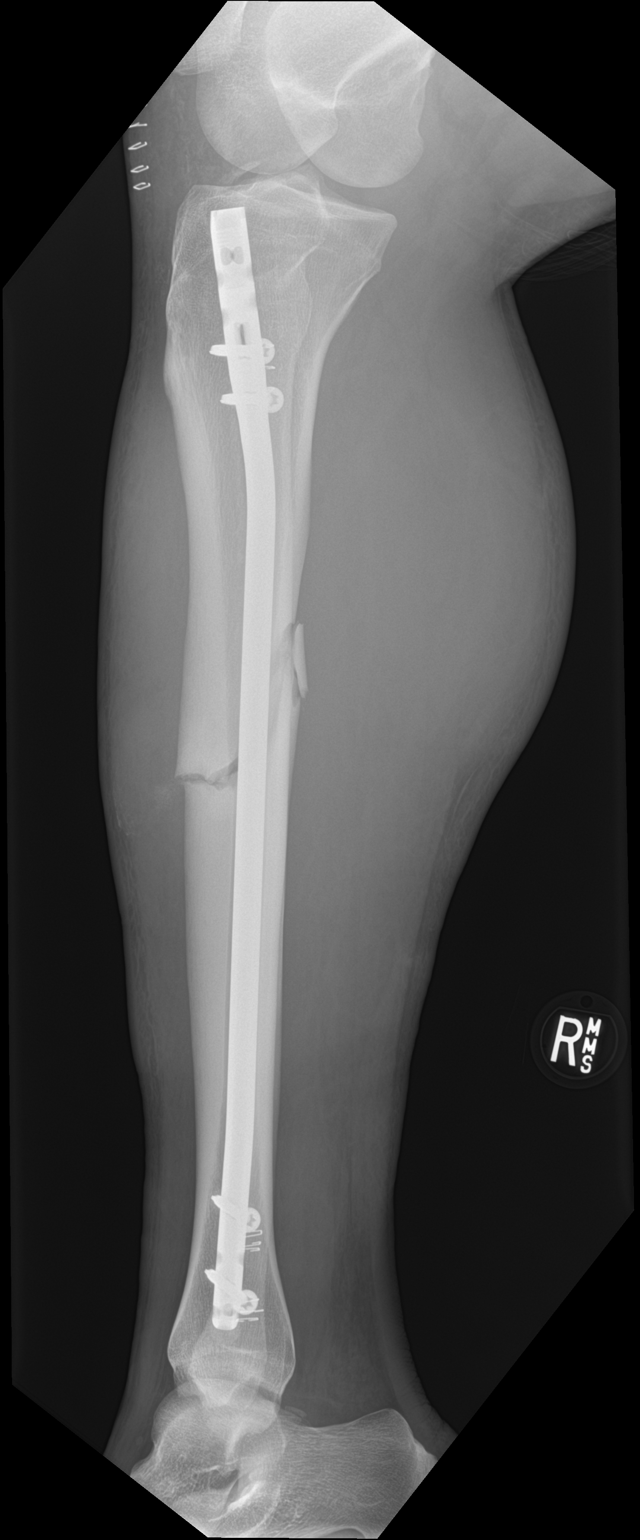

[2 of 2 positions shown; findings below may reference images not displayed]

FINDINGS: The patient has undergone prior intramedullary nail placement
through the tibia. The alignment is appropriate. The hardware
appears grossly intact. There is soft tissue swelling about the
right lower extremity which may be related to the known fracture or
recent surgical intervention.
IMPRESSION: Expected postoperative changes as above. No evidence for an acute
complicating feature.

## 2021-09-22 ENCOUNTER — Ambulatory Visit (HOSPITAL_COMMUNITY)
Admission: EM | Admit: 2021-09-22 | Discharge: 2021-09-22 | Disposition: A | Discharge status: Home / Self Care | Payer: No Typology Code available for payment source | Attending: Nurse Practitioner | Admitting: Nurse Practitioner

## 2021-09-22 DIAGNOSIS — F1290 Cannabis use, unspecified, uncomplicated: Secondary | ICD-10-CM | POA: Insufficient documentation

## 2021-09-22 DIAGNOSIS — F3132 Bipolar disorder, current episode depressed, moderate: Secondary | ICD-10-CM

## 2021-09-22 NOTE — ED Notes (Signed)
Pt waiting [employer] officer Hill to transport.

## 2021-09-22 NOTE — ED Notes (Signed)
Discharge reviewed with Gavin Spencer all questions entertained and areas of importance high lighted . Pt verbalized understanding of discharge instructions.
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
## 2021-09-22 NOTE — Discharge Instructions (Addendum)

## 2021-10-03 ENCOUNTER — Telehealth (HOSPITAL_COMMUNITY): Payer: Self-pay | Admitting: Family

## 2021-10-03 NOTE — BH Assessment (Signed)
Care Management - [facility] Follow Up Discharges  ° °Writer attempted to make contact with patient today and was unsuccessful.  Writer left a HIPPA compliant voice message.  ° °Per chart review, patient was provided with outpatient resources. ° °

## 2021-10-24 ENCOUNTER — Ambulatory Visit: Payer: Medicaid Other | Admitting: Family

## 2022-06-14 ENCOUNTER — Other Ambulatory Visit: Payer: Self-pay | Admitting: Family

## 2022-06-14 DIAGNOSIS — B20 Human immunodeficiency virus [HIV] disease: Secondary | ICD-10-CM

## 2022-06-17 ENCOUNTER — Telehealth: Payer: Self-pay

## 2022-06-17 NOTE — Telephone Encounter (Signed)
Called patient to schedule overdue appointment with office + lab work. Not able to reach him at this time. Left voicemail requesting call back. Refills denied at this time. Leatrice Jewels, RMA

## 2022-07-03 ENCOUNTER — Ambulatory Visit (INDEPENDENT_AMBULATORY_CARE_PROVIDER_SITE_OTHER): Payer: Self-pay | Admitting: Licensed Clinical Social Worker

## 2022-07-03 DIAGNOSIS — F331 Major depressive disorder, recurrent, moderate: Secondary | ICD-10-CM
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
## 2022-07-04 ENCOUNTER — Ambulatory Visit (INDEPENDENT_AMBULATORY_CARE_PROVIDER_SITE_OTHER): Payer: No Payment, Other | Admitting: Psychiatry

## 2022-07-04 ENCOUNTER — Encounter (HOSPITAL_COMMUNITY): Payer: Self-pay | Admitting: Psychiatry

## 2022-07-04 DIAGNOSIS — F319 Bipolar disorder, unspecified: Secondary | ICD-10-CM | POA: Insufficient documentation

## 2022-07-04 DIAGNOSIS — F3132 Bipolar disorder, current episode depressed, moderate: Secondary | ICD-10-CM

## 2022-07-04 MED ORDER — LAMOTRIGINE 25 MG PO TABS: ORAL_TABLET | ORAL | 0 refills | Status: DC

## 2022-07-04 NOTE — Patient Instructions (Addendum)
Thank you for attending your appointment today.  -- START lamotrigine: take 1 tablet (25 mg total) daily for 2 weeks. Then INCREASE to 2 tablets (50 mg total) daily for 2 weeks. Remain at this dose until our next visit.   -- If you miss more than 3 days of your lamotrigine, please reach out to me before taking again. -- Continue other medications as prescribed.  Please do not make any changes to medications without first discussing with your provider. If you are experiencing a psychiatric emergency, please call 911 or present to your nearest emergency department. Additional crisis, medication management, and therapy resources are included below.  [facility]  [address] [place], [place] [address] [phone] or [phone] [facility] 24/7 FOR ANYONE [address], [place], [place] Fax: [phone] [email or web address] *Interpreters available *Accepts all insurance and uninsured for Urgent Care needs *Accepts Medicaid and uninsured for outpatient treatment (below)      ONLY FOR [facility]  Below:    Outpatient New Patient Assessment/Therapy Walk-ins:        Monday -Thursday 8am until slots are full.        Every Friday 1pm-4pm  (first come, first served)                   New Patient Psychiatry/Medication Management        Monday-Friday 8am-11am (first come, first served)               For all walk-ins we ask that you arrive by 7:15am, because patients will be seen in the order of arrival.
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
## 2022-07-17 ENCOUNTER — Ambulatory Visit (INDEPENDENT_AMBULATORY_CARE_PROVIDER_SITE_OTHER): Payer: No Payment, Other | Admitting: Licensed Clinical Social Worker

## 2022-07-17 DIAGNOSIS — F3132 Bipolar disorder, current episode depressed, moderate: Secondary | ICD-10-CM | POA: Diagnosis not present

## 2022-07-17 NOTE — Progress Notes (Signed)
Virtual Visit via Video Note  I connected with Gavin Spencer on 07/17/22 at  9:00 AM EDT by a video enabled telemedicine application and verified that I am speaking with the correct person using two identifiers.  Location: Patient: At primary residence Provider: Virtual office   I discussed the limitations of evaluation and management by telemedicine and the availability of in person appointments. The patient expressed understanding and agreed to proceed.  History of Present Illness: Gavin Spencer has history of depression and anxiety and was recently assessed and diagnosed with Bipolar I disorder.   Observations/Objective: Gavin Spencer is on time and oriented x5 for session. He reports that he has been feeling anxious and he has not picked up medication from pharmacy yet, but states he will follow up today. He shared that since last visit he begin working at [employer] and was able to work for one day, the first day and he decided to quit mid-shift. He reports that it was not a good fit and he did not like to environment. He reports that he has continues to search for a new job and states that he would like to find something he can work remotely. He processed fears about returning to school and his self-confidence and motivation. He processed how he feels about his relationship with his adoptive parents. He reports that he is only in [place] because of school and he plans to move once he finishes school.  Assessment and Plan: Gavin Spencer shows some insight and takes accountability for himself and his choices. He reports that he has been coping with his anxiety symptoms by dancing and searching for a new job. He denies SI/HI, AVH or psychosis. His energy level seemed like it was elevated today and Clinician encouraged him to pick up and begin taking medication as prescribed.  Follow Up Instructions: Follow up in 2 weeks on 11/02 at 9am.   I discussed the assessment and treatment plan with the patient.  The patient was provided an opportunity to ask questions and all were answered. The patient agreed with the plan and demonstrated an understanding of the instructions.   The patient was advised to call back or seek an in-person evaluation if the symptoms worsen or if the condition fails to improve as anticipated.  I provided 55 minutes of non-face-to-face time during this encounter.   Allyson Sabal, [facility]

## 2022-07-31 ENCOUNTER — Ambulatory Visit (INDEPENDENT_AMBULATORY_CARE_PROVIDER_SITE_OTHER): Payer: No Payment, Other | Admitting: Licensed Clinical Social Worker

## 2022-07-31 DIAGNOSIS — F3132 Bipolar disorder, current episode depressed, moderate: Secondary | ICD-10-CM
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
## 2022-07-31 NOTE — Patient Instructions (Signed)
Thank you for attending your appointment today.  -- START lamotrigine: take 1 tablet (25 mg total) daily for 2 weeks. Then INCREASE to 2 tablets (50 mg total) daily for 2 weeks. Remain at this dose until our next visit.              -- If you miss more than 4 days of your lamotrigine, please reach out to me before taking again. -- Continue other medications as prescribed.  Please do not make any changes to medications without first discussing with your provider. If you are experiencing a psychiatric emergency, please call 911 or present to your nearest emergency department. Additional crisis, medication management, and therapy resources are included below.  [facility]  [address] [place], [place] [address] [phone] WALK-IN URGENT CARE 24/7 FOR ANYONE [address] [place], [place] Fax: [phone] [email or web address] *Interpreters available *Accepts all insurance and uninsured for Urgent Care needs *Accepts Medicaid and uninsured for outpatient treatment (below)      ONLY FOR [facility]  Below:    Outpatient New Patient Assessment/Therapy Walk-ins:        Monday -Thursday 8am until slots are full.        Every Friday 1pm-4pm  (first come, first served)                   New Patient Psychiatry/Medication Management        Monday-Friday 8am-11am (first come, first served)               For all walk-ins we ask that you arrive by 7:15am, because patients will be seen in the order of arrival.

## 2022-07-31 NOTE — Progress Notes (Signed)
Virtual Visit via Video Note  I connected with Gavin Spencer on 07/31/22 at  9:00 AM EDT by a video enabled telemedicine application and verified that I am speaking with the correct person using two identifiers.  Location: Patient: Home Provider: Virtual office   I discussed the limitations of evaluation and management by telemedicine and the availability of in person appointments. The patient expressed understanding and agreed to proceed.  History of Present Illness: History of anxiety and Bipolar disorder   Observations/Objective: Gavin Spencer is on time and oriented for session today. He reports that since last visit his anxiety has decreased and he has been happy as he was recently engaged over the past weekend. He reports that he has begin working on wedding plans right away and feels supported by his fiance, friends and family. 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 He still has not picked up his medication yet an dit was sent back by pharmacy and he was provided phone number to contact office to request provider to send his prescription over to the pharmacy again and he will go pick it up. He denies SI/HI, AVH or psychosis. He reports that he is continuing to look for a job but is happy that he was able to pay his rent last month and has some saved for this month.  Assessment and Plan: Anxiety levels assessed. Active listening, CBT skills used. Assessed for issues of dangerousness, mental status evaluated.  Follow Up Instructions: Next session in two weeks.   I discussed the assessment and treatment plan with the patient. The patient was provided an opportunity to ask questions and all were answered. The patient agreed with the plan and demonstrated an understanding of the instructions.   The patient was advised to call back or seek  an in-person evaluation if the symptoms worsen or if the condition fails to improve as anticipated.  I provided 40 minutes of non-face-to-face time during this encounter.   Allyson Sabal, [facility]

## 2022-08-01 ENCOUNTER — Encounter (HOSPITAL_COMMUNITY): Payer: Self-pay | Admitting: Psychiatry

## 2022-08-01 ENCOUNTER — Telehealth (INDEPENDENT_AMBULATORY_CARE_PROVIDER_SITE_OTHER): Payer: No Payment, Other | Admitting: Psychiatry

## 2022-08-01 ENCOUNTER — Other Ambulatory Visit: Payer: Self-pay

## 2022-08-01 DIAGNOSIS — F3132 Bipolar disorder, current episode depressed, moderate: Secondary | ICD-10-CM

## 2022-08-01 MED ORDER — LAMOTRIGINE 25 MG PO TABS
ORAL_TABLET | ORAL | 0 refills | Status: DC
  Filled 2022-08-01: qty 70, 42d supply, fill #0

## 2022-08-04 ENCOUNTER — Other Ambulatory Visit: Payer: Self-pay

## 2022-08-14 ENCOUNTER — Ambulatory Visit (HOSPITAL_COMMUNITY): Payer: No Payment, Other | Admitting: Licensed Clinical Social Worker

## 2022-08-14 ENCOUNTER — Encounter (HOSPITAL_COMMUNITY): Payer: Self-pay

## 2022-09-04 NOTE — Progress Notes (Signed)
Patient did not connect for virtual psychiatric medication management appointment on 09/05/22 at 9AM. Called phone and went straight to VM; left VM with callback number to reschedule.  Daine Gip, MD 09/05/22

## 2022-09-05 ENCOUNTER — Encounter (HOSPITAL_COMMUNITY): Payer: No Payment, Other | Admitting: Psychiatry

## 2022-09-05 ENCOUNTER — Encounter (HOSPITAL_COMMUNITY): Payer: Self-pay

## 2022-10-02 ENCOUNTER — Other Ambulatory Visit: Payer: Self-pay

## 2022-10-02 DIAGNOSIS — Z113 Encounter for screening for infections with a predominantly sexual mode of transmission: Secondary | ICD-10-CM

## 2022-10-02 DIAGNOSIS — Z79899 Other long term (current) drug therapy: Secondary | ICD-10-CM

## 2022-10-02 DIAGNOSIS — B20 Human immunodeficiency virus [HIV] disease: Secondary | ICD-10-CM

## 2022-10-02 NOTE — Addendum Note (Signed)
Addended by: Daisy Floro T on: 10/02/2022 03:36 PM   Modules accepted: Orders

## 2022-10-03 ENCOUNTER — Other Ambulatory Visit: Payer: Self-pay

## 2022-10-03 ENCOUNTER — Other Ambulatory Visit (HOSPITAL_COMMUNITY)
Admission: RE | Admit: 2022-10-03 | Discharge: 2022-10-03 | Disposition: A | Discharge status: Home / Self Care | Payer: Self-pay | Source: Ambulatory Visit | Attending: Family | Admitting: Family

## 2022-10-03 DIAGNOSIS — B20 Human immunodeficiency virus [HIV] disease: Secondary | ICD-10-CM | POA: Insufficient documentation

## 2022-10-03 DIAGNOSIS — Z79899 Other long term (current) drug therapy: Secondary | ICD-10-CM

## 2022-10-03 DIAGNOSIS — Z113 Encounter for screening for infections with a predominantly sexual mode of transmission: Secondary | ICD-10-CM | POA: Insufficient documentation

## 2022-10-03 DIAGNOSIS — Z21 Asymptomatic human immunodeficiency virus [HIV] infection status: Secondary | ICD-10-CM | POA: Insufficient documentation

## 2022-10-05 ENCOUNTER — Other Ambulatory Visit (HOSPITAL_COMMUNITY): Payer: Self-pay | Admitting: Psychiatry

## 2022-10-06 LAB — LIPID PANEL
Cholesterol: 151 mg/dL (ref <200)
HDL: 51 mg/dL (ref >=40)
LDL Cholesterol (Calc): 87 mg/dL (calc)
Non-HDL Cholesterol (Calc): 100 mg/dL (calc) (ref <130)
Total CHOL/HDL Ratio: 3 (calc) (ref <5.0)
Triglycerides: 44 mg/dL (ref <150)

## 2022-10-06 LAB — COMPLETE METABOLIC PANEL WITH GFR
AG Ratio: 1.9 (calc) (ref 1.0–2.5)
ALT: 15 U/L (ref 9–46)
AST: 14 U/L (ref 10–40)
Albumin: 5.4 g/dL — ABNORMAL HIGH (ref 3.6–5.1)
Alkaline phosphatase (APISO): 75 U/L (ref 36–130)
BUN: 17 mg/dL (ref 7–25)
CO2: 28 mmol/L (ref 20–32)
Calcium: 10.8 mg/dL — ABNORMAL HIGH (ref 8.6–10.3)
Chloride: 103 mmol/L (ref 98–110)
Creat: 0.99 mg/dL (ref 0.60–1.24)
Globulin: 2.9 g/dL (calc) (ref 1.9–3.7)
Glucose, Bld: 90 mg/dL (ref 65–99)
Potassium: 4.6 mmol/L (ref 3.5–5.3)
Sodium: 142 mmol/L (ref 135–146)
Total Bilirubin: 0.5 mg/dL (ref 0.2–1.2)
Total Protein: 8.3 g/dL — ABNORMAL HIGH (ref 6.1–8.1)
eGFR: 110 mL/min/{1.73_m2} (ref >=60)

## 2022-10-06 LAB — CBC WITH DIFFERENTIAL/PLATELET
Absolute Monocytes: 319 cells/uL (ref 200–950)
Basophils Absolute: 20 cells/uL (ref 0–200)
Basophils Relative: 0.4 %
Eosinophils Absolute: 29 cells/uL (ref 15–500)
Eosinophils Relative: 0.6 %
HCT: 45.9 % (ref 38.5–50.0)
Hemoglobin: 16.2 g/dL (ref 13.2–17.1)
Lymphs Abs: 2303 cells/uL (ref 850–3900)
MCH: 28.9 pg (ref 27.0–33.0)
MCHC: 35.3 g/dL (ref 32.0–36.0)
MCV: 82 fL (ref 80.0–100.0)
MPV: 9.4 fL (ref 7.5–12.5)
Monocytes Relative: 6.5 %
Neutro Abs: 2230 cells/uL (ref 1500–7800)
Neutrophils Relative %: 45.5 %
Platelets: 322 10*3/uL (ref 140–400)
RBC: 5.6 10*6/uL (ref 4.20–5.80)
RDW: 12.7 % (ref 11.0–15.0)
Total Lymphocyte: 47 %
WBC: 4.9 10*3/uL (ref 3.8–10.8)

## 2022-10-06 LAB — RPR TITER: RPR Titer: 1:4 {titer} — ABNORMAL HIGH

## 2022-10-06 LAB — URINE CYTOLOGY ANCILLARY ONLY
Chlamydia: NEGATIVE
Comment: NEGATIVE
Comment: NORMAL
Neisseria Gonorrhea: NEGATIVE

## 2022-10-06 LAB — HIV-1 RNA QUANT-NO REFLEX-BLD
HIV 1 RNA Quant: NOT DETECTED Copies/mL
HIV-1 RNA Quant, Log: NOT DETECTED Log cps/mL

## 2022-10-06 LAB — T PALLIDUM AB: T Pallidum Abs: POSITIVE — AB

## 2022-10-06 LAB — T-HELPER CELLS (CD4) COUNT (NOT AT ARMC)
Absolute CD4: 699 cells/uL (ref 490–1740)
CD4 T Helper %: 27 % — ABNORMAL LOW (ref 30–61)
Total lymphocyte count: 2607 cells/uL (ref 850–3900)

## 2022-10-06 LAB — RPR: RPR Ser Ql: REACTIVE — AB

## 2022-10-07 ENCOUNTER — Other Ambulatory Visit (HOSPITAL_COMMUNITY): Payer: Self-pay

## 2022-10-07 MED ORDER — LAMOTRIGINE 25 MG PO TABS
50.0000 mg | ORAL_TABLET | Freq: Every day | ORAL | 1 refills | Status: AC
  Filled 2022-10-07: qty 60, 30d supply, fill #0

## 2022-10-07 NOTE — Telephone Encounter (Signed)
Message acknowledged and reviewed.  Patient medications to be e-prescribed to pharmacy of choice

## 2022-10-08 ENCOUNTER — Other Ambulatory Visit: Payer: Self-pay

## 2022-10-10 NOTE — Progress Notes (Signed)
This can be covered with:  HIV positive/asymptomatic HIV infection status   Please let me know if you need any additional information.  Thanks.

## 2022-10-11 ENCOUNTER — Other Ambulatory Visit (HOSPITAL_COMMUNITY): Payer: Self-pay

## 2022-10-11 ENCOUNTER — Other Ambulatory Visit: Payer: Self-pay

## 2022-10-24 ENCOUNTER — Telehealth (INDEPENDENT_AMBULATORY_CARE_PROVIDER_SITE_OTHER): Payer: Self-pay | Admitting: Family

## 2022-10-24 ENCOUNTER — Other Ambulatory Visit: Payer: Self-pay

## 2022-10-24 ENCOUNTER — Encounter: Payer: Self-pay | Admitting: Family

## 2022-10-24 DIAGNOSIS — Z0000 Encounter for general adult medical examination without abnormal findings: Secondary | ICD-10-CM

## 2022-10-24 DIAGNOSIS — A539 Syphilis, unspecified: Secondary | ICD-10-CM

## 2022-10-24 DIAGNOSIS — B20 Human immunodeficiency virus [HIV] disease: Secondary | ICD-10-CM

## 2022-10-24 MED ORDER — BIKTARVY 50-200-25 MG PO TABS: 1.0000 | ORAL_TABLET | Freq: Every day | ORAL | 5 refills | Status: AC

## 2022-10-24 NOTE — Progress Notes (Signed)
Brief Narrative   Patient ID: Gavin Spencer, male    DOB: 2001/04/04, 22 y.o.   MRN: [id number]  Mr. Hillmer is a 22 y/o AA male diagnosed with HIV disease diagnosed on 07/28/19 with risk factor of MSM. Initial viral load was 224,000 and CD4 count of 394. Genotype was without significant resistance. Entered care at [facility] Stage 2. [id number] negative. No history of opportunistic infection. Sole medication regimen of Biktarvy.    Subjective:    Chief Complaint  Patient presents with   Follow-up   HIV Positive/AIDS     Virtual Visit via Telephone/Video Note   I connected with [name] on 10/24/2022 at 12:38 PM by video chat  and verified that I am speaking with the correct person using two identifiers.   I discussed the limitations, risks, security and privacy concerns of performing an evaluation and management service by telephone and the availability of in person appointments. I also discussed with the patient that there may be a patient responsible charge related to this service. The patient expressed understanding and agreed to proceed.  Location:  Patient: Home Provider: [facility]  HPI:  Gavin Spencer is a 22 y.o. male with HIV disease last seen on 06/10/2021 with well-controlled virus and good adherence and tolerance to Biktarvy.  Viral load was undetectable with CD4 count of 890.  Most recent lab work completed on 10/03/2022 with viral load that remains undetectable and CD4 count of 699.  Kidney function, liver function, electrolytes within normal ranges.  Lipid profile with triglycerides 44, LDL 87, and HDL 51.  STD testing was negative for gonorrhea and chlamydia.  RPR titer stable at 1: 4 with previous treatment at 1: 128.  Video visit today for routine follow-up.  Tc has been doing well since his last office visit.  Continues to take Biktarvy as prescribed with no adverse side effects and no problems obtaining medication from the pharmacy.  Feeling well today  with no new concerns/complaints.  Discussed importance of safe sexual practices and condom use.  Due for influenza vaccination.  Denies fevers, chills, night sweats, headaches, changes in vision, neck pain/stiffness, nausea, diarrhea, vomiting, lesions or rashes.    Allergies  Allergen Reactions   Shellfish Allergy Anaphylaxis    Seafood       Outpatient Medications Prior to Visit  Medication Sig Dispense Refill   lamoTRIgine (LAMICTAL) 25 MG tablet Take 2 tablets (50 mg total) by mouth daily. 60 tablet 1   bictegravir-emtricitabine-tenofovir AF (BIKTARVY) 50-200-25 MG TABS tablet Take 1 tablet by mouth daily. 30 tablet 5   No facility-administered medications prior to visit.     Past Medical History:  Diagnosis Date   ADHD    HIV (human immunodeficiency virus infection) ([place])    Immune deficiency disorder ([place])      Past Surgical History:  Procedure Laterality Date   ORIF TIBIA FRACTURE        Review of Systems  Constitutional:  Negative for appetite change, chills, fatigue, fever and unexpected weight change.  Eyes:  Negative for visual disturbance.  Respiratory:  Negative for cough, chest tightness, shortness of breath and wheezing.   Cardiovascular:  Negative for chest pain and leg swelling.  Gastrointestinal:  Negative for abdominal pain, constipation, diarrhea, nausea and vomiting.  Genitourinary:  Negative for dysuria, flank pain, frequency, genital sores, hematuria and urgency.  Skin:  Negative for rash.  Allergic/Immunologic: Negative for immunocompromised state.  Neurological:  Negative for dizziness and headaches.  Objective:    There were no vitals taken for this visit. Nursing note and vital signs reviewed.  Physical Exam Constitutional:      General: He is not in acute distress.    Appearance: He is well-developed.  Eyes:     Conjunctiva/sclera: Conjunctivae normal.  Cardiovascular:     Rate and Rhythm: Normal rate and regular rhythm.      Heart sounds: Normal heart sounds. No murmur heard.    No friction rub. No gallop.  Pulmonary:     Effort: Pulmonary effort is normal. No respiratory distress.     Breath sounds: Normal breath sounds. No wheezing or rales.  Chest:     Chest wall: No tenderness.  Abdominal:     General: Bowel sounds are normal.     Palpations: Abdomen is soft.     Tenderness: There is no abdominal tenderness.  Musculoskeletal:     Cervical back: Neck supple.  Lymphadenopathy:     Cervical: No cervical adenopathy.  Skin:    General: Skin is warm and dry.     Findings: No rash.  Neurological:     Mental Status: He is alert and oriented to person, place, and time.  Psychiatric:        Behavior: Behavior normal.        Thought Content: Thought content normal.        Judgment: Judgment normal.         10/24/2022    9:56 AM 07/03/2022    8:12 AM 06/10/2021    8:35 AM 02/18/2021    8:41 AM 07/30/2020    3:00 PM  Depression screen PHQ 2/9  Decreased Interest 0  0 0 0  Down, Depressed, Hopeless 0  0 0 0  PHQ - 2 Score 0  0 0 0  Altered sleeping       Tired, decreased energy       Change in appetite       Feeling bad or failure about yourself        Trouble concentrating       Moving slowly or fidgety/restless       Suicidal thoughts       PHQ-9 Score       Difficult doing work/chores          Information is confidential and restricted. Go to Review Flowsheets to unlock data.       Assessment & Plan:    Patient Active Problem List   Diagnosis Date Noted   Bipolar disorder (HCC) 07/04/2022   Syphilis 07/30/2020   Healthcare maintenance 09/02/2019   HIV disease (HCC) 08/03/2019     Problem List Items Addressed This Visit       Other   HIV disease (HCC)    Nevin continues to have well-controlled virus with good adherence and tolerance to [employer].  Reviewed lab work and discussed plan of care.  Encouraged routine follow-up.  Continue current dose of Biktarvy.  Plan for follow-up  in 6 months or sooner if needed with lab work 1 to 2 weeks prior to appointment.      Relevant Medications   bictegravir-emtricitabine-tenofovir AF (BIKTARVY) 50-200-25 MG TABS tablet   Other Relevant Orders   BASIC METABOLIC PANEL WITH GFR   HIV-1 RNA quant-no reflex-bld   T-helper cell (CD4)- ([facility] only)   Healthcare maintenance - Primary    Discussed importance of safe sexual practice and condom use. Condoms and STD testing offered.  Due for influenza vaccination  and will schedule nurse visit.  Vaccine counseling provided.       Syphilis    Kamel's RPR titer is positive at 1: 4 which is down from treatment level 1: 128.  No current signs/symptoms.  No need for treatment at this time.  Continue to monitor RPR titer.      Relevant Medications   bictegravir-emtricitabine-tenofovir AF (BIKTARVY) 50-200-25 MG TABS tablet   Other Relevant Orders   RPR     I am having [name] maintain his lamoTRIgine and Biktarvy.   Meds ordered this encounter  Medications   bictegravir-emtricitabine-tenofovir AF (BIKTARVY) 50-200-25 MG TABS tablet    Sig: Take 1 tablet by mouth daily.    Dispense:  30 tablet    Refill:  5     I discussed the assessment and treatment plan with the patient. The patient was provided an opportunity to ask questions and all were answered. The patient agreed with the plan and demonstrated an understanding of the instructions.   The patient was advised to call back or seek an in-person evaluation if the symptoms worsen or if the condition fails to improve as anticipated.   I provided  10  minutes of non-face-to-face time during this encounter.  Follow-up: Return in about 6 months (around 04/24/2023), or if symptoms worsen or fail to improve.   Terri Piedra, MSN, FNP-C Nurse Practitioner [facility] for Infectious Disease [place] number: [phone]

## 2022-10-24 NOTE — Assessment & Plan Note (Signed)
Discussed importance of safe sexual practice and condom use. Condoms and STD testing offered.  Due for influenza vaccination and will schedule nurse visit.  Vaccine counseling provided.

## 2022-10-24 NOTE — Patient Instructions (Signed)
Nice to see you.  Continue to take your medication daily as prescribed.  Refills have been sent to the pharmacy.  Plan for follow up in 6 months or sooner if needed with lab work 1-2 weeks prior to appointment.   Have a great day and stay safe!  

## 2022-10-24 NOTE — Assessment & Plan Note (Signed)
Nahsir continues to have well-controlled virus with good adherence and tolerance to [employer].  Reviewed lab work and discussed plan of care.  Encouraged routine follow-up.  Continue current dose of Biktarvy.  Plan for follow-up in 6 months or sooner if needed with lab work 1 to 2 weeks prior to appointment.

## 2022-10-24 NOTE — Assessment & Plan Note (Signed)
Gavin Spencer's RPR titer is positive at 1: 4 which is down from treatment level 1: 128.  No current signs/symptoms.  No need for treatment at this time.  Continue to monitor RPR titer.

## 2022-12-27 ENCOUNTER — Other Ambulatory Visit: Payer: Self-pay

## 2023-01-13 ENCOUNTER — Other Ambulatory Visit (HOSPITAL_COMMUNITY): Payer: Self-pay

## 2023-01-21 ENCOUNTER — Other Ambulatory Visit (HOSPITAL_COMMUNITY): Payer: Self-pay

## 2023-04-27 ENCOUNTER — Other Ambulatory Visit: Payer: Self-pay

## 2023-05-04 ENCOUNTER — Telehealth (INDEPENDENT_AMBULATORY_CARE_PROVIDER_SITE_OTHER): Payer: Self-pay | Admitting: Family

## 2023-05-04 ENCOUNTER — Encounter: Payer: Self-pay | Admitting: Family

## 2023-05-04 DIAGNOSIS — B20 Human immunodeficiency virus [HIV] disease: Secondary | ICD-10-CM

## 2023-05-04 NOTE — Progress Notes (Signed)
Brief Narrative   Patient ID: Gavin Spencer, male    DOB: [birth year], 22 y.o.   MRN: [id number]  Gavin Spencer is a 22 y/o AA male diagnosed with HIV disease diagnosed on 07/28/19 with risk factor of MSM. Initial viral load was 224,000 and CD4 count of 394. Genotype was without significant resistance. Entered care at [facility] Stage 2. [id number] negative. No history of opportunistic infection. Sole medication regimen of Biktarvy.    Subjective:    Chief Complaint  Patient presents with   Follow-up    B20   HIV Positive/AIDS     Virtual Visit via Telephone/Video Note   I connected with Gavin Spencer on 05/04/2023 at 10:09 am by video and verified that I am speaking with the correct person using two identifiers.   I discussed the limitations, risks, security and privacy concerns of performing an evaluation and management service by telephone and the availability of in person appointments. I also discussed with the patient that there may be a patient responsible charge related to this service. The patient expressed understanding and agreed to proceed.  Location:  Patient: Home Provider: [facility]  HPI:  Gavin Spencer is a 22 y.o. male with HIV disease last seen through telehealth visit on 10/24/2022 with well-controlled virus and good adherence and tolerance to Biktarvy.  Viral load was undetectable with CD4 count of 699.  RPR titer was stable at 1: 4.  Other STD testing negative.  Kidney function, liver function, electrolytes within normal ranges.  Lipid profile with LDL 87, HDL 51, and triglycerides 44.  Telehealth visit today for follow-up.  Mr. Grizzle has been doing okay since his last office visit, however has had a significant number of pills and put his health on the back burner.  Has been out of medication for the last couple of months as he is not able to afford his medication and not currently working. Has concerns about getting his medications.   Denies fevers, chills,  night sweats, headaches, changes in vision, neck pain/stiffness, nausea, diarrhea, vomiting, lesions or rashes.  Allergies  Allergen Reactions   Shellfish Allergy Anaphylaxis    Seafood       Outpatient Medications Prior to Visit  Medication Sig Dispense Refill   bictegravir-emtricitabine-tenofovir AF (BIKTARVY) 50-200-25 MG TABS tablet Take 1 tablet by mouth daily. 30 tablet 5   lamoTRIgine (LAMICTAL) 25 MG tablet Take 2 tablets (50 mg total) by mouth daily. 60 tablet 1   No facility-administered medications prior to visit.     Past Medical History:  Diagnosis Date   ADHD    HIV (human immunodeficiency virus infection) (HCC)    Immune deficiency disorder (HCC)      Past Surgical History:  Procedure Laterality Date   ORIF TIBIA FRACTURE        Review of Systems  Constitutional:  Negative for appetite change, chills, fatigue, fever and unexpected weight change.  Eyes:  Negative for visual disturbance.  Respiratory:  Negative for cough, chest tightness, shortness of breath and wheezing.   Cardiovascular:  Negative for chest pain and leg swelling.  Gastrointestinal:  Negative for abdominal pain, constipation, diarrhea, nausea and vomiting.  Genitourinary:  Negative for dysuria, flank pain, frequency, genital sores, hematuria and urgency.  Skin:  Negative for rash.  Allergic/Immunologic: Negative for immunocompromised state.  Neurological:  Negative for dizziness and headaches.      Objective:    There were no vitals taken for this visit. Nursing note and vital signs reviewed.  Physical Exam Constitutional:      General: He is not in acute distress.    Appearance: Normal appearance. He is well-developed.  Neurological:     Mental Status: He is alert.  Psychiatric:        Mood and Affect: Mood normal.         05/04/2023   10:05 AM 10/24/2022    9:56 AM 07/03/2022    8:12 AM 06/10/2021    8:35 AM 02/18/2021    8:41 AM  Depression screen PHQ 2/9  Decreased  Interest 0 0  0 0  Down, Depressed, Hopeless 0 0  0 0  PHQ - 2 Score 0 0  0 0  Altered sleeping       Tired, decreased energy       Change in appetite       Feeling bad or failure about yourself        Trouble concentrating       Moving slowly or fidgety/restless       Suicidal thoughts       PHQ-9 Score       Difficult doing work/chores          Information is confidential and restricted. Go to Review Flowsheets to unlock data.       Assessment & Plan:    Patient Active Problem List   Diagnosis Date Noted   Bipolar disorder (HCC) 07/04/2022   Syphilis 07/30/2020   Healthcare maintenance 09/02/2019   HIV disease (HCC) 08/03/2019     Problem List Items Addressed This Visit       Other   HIV disease (HCC) - Primary    Mr. Pereda has poorly controlled virus secondary to being off medications for the last 3 months likely related to gap and financial coverage.  Discussed importance of informing clinic regarding obtaining medication and if any changes occur.  Reviewed previous lab work discussed plan of care and U equals U.  Restart Biktarvy.  Scheduled financial appointment for tomorrow to apply for Medicaid and or [employer]/UMAP. Lab work to be completed once financial assistance established. Plan for follow up in 1 month or sooner if needed with lab work on the same day.         I am having Gavin Spencer maintain his lamoTRIgine and Biktarvy.   I discussed the assessment and treatment plan with the patient. The patient was provided an opportunity to ask questions and all were answered. The patient agreed with the plan and demonstrated an understanding of the instructions.   The patient was advised to call back or seek an in-person evaluation if the symptoms worsen or if the condition fails to improve as anticipated.   I provided 10  minutes of non-face-to-face time during this encounter.  Follow-up: Return in about 1 month (around 06/04/2023), or if symptoms worsen or  fail to improve.   Marcos Eke, MSN, FNP-C Nurse Practitioner [facility] Main number: [phone]

## 2023-05-04 NOTE — Patient Instructions (Signed)
Nice to speak with you.  Restart taking your medication daily.  Attend your appointment tomorrow to renew financial assistance and get samples.   Plan for follow up in 1 month or sooner if needed with lab work on the same day.   Have a great day and stay safe!

## 2023-05-04 NOTE — Assessment & Plan Note (Signed)
Mr. Stueber has poorly controlled virus secondary to being off medications for the last 3 months likely related to gap and financial coverage.  Discussed importance of informing clinic regarding obtaining medication and if any changes occur.  Reviewed previous lab work discussed plan of care and U equals U.  Restart Biktarvy.  Scheduled financial appointment for tomorrow to apply for Medicaid and or Ryan White/UMAP. Lab work to be completed once financial assistance established. Plan for follow up in 1 month or sooner if needed with lab work on the same day.

## 2023-05-05 ENCOUNTER — Ambulatory Visit: Payer: Self-pay

## 2023-05-05 ENCOUNTER — Other Ambulatory Visit: Payer: Self-pay

## 2023-05-05 ENCOUNTER — Other Ambulatory Visit (HOSPITAL_COMMUNITY): Payer: Self-pay

## 2023-05-14 ENCOUNTER — Other Ambulatory Visit: Payer: Self-pay | Admitting: Pharmacist

## 2023-05-14 DIAGNOSIS — B20 Human immunodeficiency virus [HIV] disease: Secondary | ICD-10-CM

## 2023-05-14 MED ORDER — BIKTARVY 50-200-25 MG PO TABS: 1.0000 | ORAL_TABLET | Freq: Every day | ORAL | Status: AC

## 2023-05-14 MED ORDER — BIKTARVY 50-200-25 MG PO TABS: 1.0000 | ORAL_TABLET | Freq: Every day | ORAL | Status: DC

## 2023-05-14 NOTE — Progress Notes (Signed)
 Medication Samples have been provided to the patient.  Drug name: Biktarvy        Strength: 50/200/25 mg       Qty: 14 tablets (2 bottles)   LOT: [id number]   Exp.Date: 04/2025  Dosing instructions: Take one tablet by mouth once daily  The patient has been instructed regarding the correct time, dose, and frequency of taking this medication, including desired effects and most common side effects.   Cassie L. Jannette Fogo, PharmD, BCIDP, AAHIVP, CPP Clinical Pharmacist Practitioner Infectious Diseases Clinical Pharmacist [facility] 09/10/2020, 10:07 AM

## 2023-12-23 ENCOUNTER — Emergency Department (HOSPITAL_COMMUNITY)
Admission: EM | Admit: 2023-12-23 | Discharge: 2023-12-23 | Disposition: A | Discharge status: Home / Self Care | Attending: Emergency Medicine | Admitting: Emergency Medicine

## 2023-12-23 ENCOUNTER — Emergency Department (HOSPITAL_COMMUNITY)

## 2023-12-23 ENCOUNTER — Other Ambulatory Visit: Payer: Self-pay

## 2023-12-23 ENCOUNTER — Encounter (HOSPITAL_COMMUNITY): Payer: Self-pay

## 2023-12-23 DIAGNOSIS — X501XXA Overexertion from prolonged static or awkward postures, initial encounter: Secondary | ICD-10-CM | POA: Insufficient documentation

## 2023-12-23 DIAGNOSIS — S8992XA Unspecified injury of left lower leg, initial encounter: Secondary | ICD-10-CM | POA: Diagnosis present

## 2023-12-23 DIAGNOSIS — Z21 Asymptomatic human immunodeficiency virus [HIV] infection status: Secondary | ICD-10-CM | POA: Insufficient documentation

## 2023-12-23 MED ORDER — NAPROXEN 375 MG PO TABS
375.0000 mg | ORAL_TABLET | Freq: Two times a day (BID) | ORAL | 0 refills | Status: DC
  Filled 2023-12-23: qty 20, 10d supply, fill #0

## 2023-12-23 MED ORDER — NAPROXEN 375 MG PO TABS: 375.0000 mg | ORAL_TABLET | Freq: Two times a day (BID) | ORAL | 0 refills | Status: AC

## 2023-12-23 NOTE — ED Triage Notes (Signed)
 Pt was at cheer competition this weekend. Pt came down wrong on his left leg. Pt suspects he torn his meniscus or knee cap dislocation.

## 2023-12-23 NOTE — ED Provider Notes (Signed)
 Pt signed out by Dr. Lynelle Doctor pending xray of left knee.  Xray reviewed by me.  I agree with the radiologist.  Negative.   Pt feels like his knee is unstable when he walks, so he's given a knee hinge brace and crutches.  He is to f/u with ortho.  Return if worse.    Jacalyn Lefevre, MD 12/23/23 [phone]

## 2023-12-23 NOTE — Progress Notes (Signed)
 Orthopedic Tech Progress Note Patient Details:  Gavin Spencer 2001-03-20 [id number]  Ortho Devices Type of Ortho Device: Knee Sleeve Ortho Device/Splint Location: LLE Ortho Device/Splint Interventions: Ordered, Other (comment)   Post Interventions Patient Tolerated: Well Instructions Provided: Care of device  Donald Pore 12/23/2023, 5:05 PM

## 2023-12-23 NOTE — ED Provider Notes (Signed)
 [facility] AT [facility] Provider Note   CSN: [id number] Arrival date & time: 12/23/23  1322     History  Chief Complaint  Patient presents with   Knee Injury    Gavin Spencer is a 23 y.o. male.  HPI   Pt has history of HIV. Patient was participating in a cheer competition this weekend when he landed on his knee wrong and twisted his left leg.  Patient has been having persistent pain and soreness in his legs since then.  He is able to bear weight but it hurts.  The pain is greatest on the lateral aspect.  Patient was concerned that he may have dislocated his knee or torn a meniscus  Home Medications Prior to Admission medications   Medication Sig Start Date End Date Taking? Authorizing Provider  naproxen (NAPROSYN) 375 MG tablet Take 1 tablet (375 mg total) by mouth 2 (two) times daily. 12/23/23  Yes Linwood Dibbles, MD  bictegravir-emtricitabine-tenofovir AF (BIKTARVY) 50-200-25 MG TABS tablet Take 1 tablet by mouth daily. 10/24/22   Veryl Speak, FNP  lamoTRIgine (LAMICTAL) 25 MG tablet Take 2 tablets (50 mg total) by mouth daily. 10/07/22   Nwoko, Tommas Olp, PA      Allergies    Shellfish allergy    Review of Systems   Review of Systems  Physical Exam Updated Vital Signs BP 137/66 (BP Location: Right Arm)   Pulse (!) 101   Temp 98.1 F (36.7 C) (Oral)   Resp 18   Ht 1.676 m (5\' 6" )   Wt 74.8 kg   SpO2 99%   BMI 26.63 kg/m  Physical Exam Vitals and nursing note reviewed.  Constitutional:      General: He is not in acute distress.    Appearance: He is well-developed.  HENT:     Head: Normocephalic and atraumatic.     Right Ear: External ear normal.     Left Ear: External ear normal.  Eyes:     General: No scleral icterus.       Right eye: No discharge.        Left eye: No discharge.     Conjunctiva/sclera: Conjunctivae normal.  Neck:     Trachea: No tracheal deviation.  Cardiovascular:     Rate and Rhythm: Normal rate.   Pulmonary:     Effort: Pulmonary effort is normal. No respiratory distress.     Breath sounds: No stridor.  Abdominal:     General: There is no distension.  Musculoskeletal:        General: Tenderness present. No swelling or deformity.     Cervical back: Neck supple.     Left knee: No swelling, deformity or effusion. Tenderness present over the lateral joint line. No medial joint line tenderness. No LCL laxity, MCL laxity, ACL laxity or PCL laxity.Normal alignment.     Comments: Tenderness palpation lateral aspect left knee, no effusion noted, no gross deformity noted  Skin:    General: Skin is warm and dry.     Findings: No rash.  Neurological:     Mental Status: He is alert. Mental status is at baseline.     Cranial Nerves: No dysarthria or facial asymmetry.     Motor: No seizure activity.     ED Results / Procedures / Treatments   Labs (all labs ordered are listed, but only abnormal results are displayed) Labs Reviewed - No data to display  EKG None  Radiology No results found.  Procedures Procedures    Medications Ordered in ED Medications - No data to display  ED Course/ Medical Decision Making/ A&P                                 Medical Decision Making Amount and/or Complexity of Data Reviewed Radiology: ordered.  Risk Prescription drug management.   Patient presented to ED for evaluation of a knee injury.  No significant swelling noted on exam.  Patient does not have any ends neurovascular injury.  X-rays have been ordered.  Results are pending.  No obvious fracture or dislocation on my initial review.  Will plan on crutches and anticipate follow-up with orthopedics as an outpatient        Final Clinical Impression(s) / ED Diagnoses Final diagnoses:  Injury of left knee, initial encounter    Rx / DC Orders ED Discharge Orders          Ordered    naproxen (NAPROSYN) 375 MG tablet  2 times daily        12/23/23 1536               Linwood Dibbles, MD 12/23/23 1538

## 2023-12-23 NOTE — Progress Notes (Signed)
 Orthopedic Tech Progress Note Patient Details:  Gavin Spencer 2001/03/01 [id number]  Ortho Devices Type of Ortho Device: Crutches Ortho Device/Splint Interventions: Ordered, Application, AdjustmentPT has used crutches before   Post Interventions Patient Tolerated: Well, Ambulated well Instructions Provided: Poper ambulation with device, Care of device  Donald Pore 12/23/2023, 4:49 PM

## 2023-12-23 NOTE — Discharge Instructions (Signed)
 Take the medications to help with pain and discomfort.  Follow-up with the orthopedic doctor for further evaluation

## 2023-12-23 NOTE — ED Notes (Signed)
 Ortho bedside with crutches

## 2023-12-24 ENCOUNTER — Other Ambulatory Visit: Payer: Self-pay

## 2024-06-06 ENCOUNTER — Telehealth: Payer: Self-pay

## 2024-06-06 NOTE — Telephone Encounter (Signed)
 Attempt to Re-Engage in Care  No office visit or HIV labs completed at [facility] within the last 12 months. Patient is considered out of care.   Last [facility] Visit: 05/04/23  Last HIV Viral Load:  HIV 1 RNA Quant  Date Value Ref Range Status  10/03/2022 Not Detected Copies/mL Final    Last CD4 Count:  CD4 T Cell Abs  Date Value Ref Range Status  06/10/2021 890 400 - 1,790 /uL Final    Medication Dispense History:   Dispensed Days Supply Quantity Provider Pharmacy   BIKTARVY  50-200-25 MG TABLET 11/04/2022 1 1 tablet  DEFAULT [facility]  BIKTARVY  50-200-25 MG TABLET 10/28/2022 1 1 tablet  DEFAULT [facility]  BIKTARVY  50-200-25 MG TABLET 05/27/2022 30 30 tablet  DEFAULT [facility]   Biktarvy  has not been ordered since 09/2022 (5 refills).  Interventions: Florence Lowing to schedule appointment, no answer. Left HIPAA compliant voicemail requesting callback. MyChart message sent.   Local ED visit in March 2025.   No recent updates in Care Everywhere. No labs in [facility].  Duration of Services: 5 minutes  Jackelynn Hosie, BSN, Charity fundraiser

## 2024-07-08 ENCOUNTER — Telehealth: Payer: Self-pay

## 2024-07-08 NOTE — Telephone Encounter (Signed)
 LINKAGE & RETENTION IN CARE  Last [facility] Visit: 05/04/23  Last HIV Viral Load:  HIV 1 RNA Quant  Date Value Ref Range Status  10/03/2022 Not Detected Copies/mL Final    Last CD4 Count:  CD4 T Cell Abs  Date Value Ref Range Status  06/10/2021 890 400 - 1,790 /uL Final    Medication Dispense History:   Dispensed Days Supply Quantity Provider Pharmacy   BIKTARVY  50-200-25 MG TABLET 11/04/2022 1 1 tablet  DEFAULT [facility]  BIKTARVY  50-200-25 MG TABLET 10/28/2022 1 1 tablet  [facility]  BIKTARVY  50-200-25 MG TABLET 05/27/2022 30 30 tablet  DEFAULT [facility]    Interventions: Called patient, no answer. Left HIPAA compliant voicemail requesting callback.    Duration of Services: 5 minutes  Meiko Stranahan, BSN, Charity fundraiser

## 2024-08-16 ENCOUNTER — Telehealth: Payer: Self-pay

## 2024-08-16 NOTE — Telephone Encounter (Signed)
 Called patient to schedule appointment, no answer. Left HIPAA compliant voicemail requesting callback.   Hamilton Marinello, BSN, RN

## 2024-10-14 ENCOUNTER — Telehealth: Payer: Self-pay

## 2024-10-14 NOTE — Telephone Encounter (Signed)
 Called patient to schedule appointment, no answer. Left HIPAA compliant voicemail requesting callback.   Letter mailed to address on file.   Keithan Dileonardo, BSN, RN
